# Patient Record
Sex: Male | Born: 1970 | Race: White | Hispanic: No | Marital: Married | Smoking: Former smoker
Health system: Southern US, Community
[De-identification: ages and names within clinical notes are randomized; demographics above are authoritative.]

## PROBLEM LIST (undated history)

## (undated) DIAGNOSIS — F419 Anxiety disorder, unspecified: Secondary | ICD-10-CM

## (undated) DIAGNOSIS — G4730 Sleep apnea, unspecified: Secondary | ICD-10-CM

## (undated) DIAGNOSIS — E039 Hypothyroidism, unspecified: Secondary | ICD-10-CM

## (undated) DIAGNOSIS — S72009A Fracture of unspecified part of neck of unspecified femur, initial encounter for closed fracture: Secondary | ICD-10-CM

## (undated) DIAGNOSIS — K760 Fatty (change of) liver, not elsewhere classified: Secondary | ICD-10-CM

## (undated) DIAGNOSIS — F32A Depression, unspecified: Secondary | ICD-10-CM

## (undated) DIAGNOSIS — I10 Essential (primary) hypertension: Secondary | ICD-10-CM

## (undated) DIAGNOSIS — J45909 Unspecified asthma, uncomplicated: Secondary | ICD-10-CM

## (undated) DIAGNOSIS — E785 Hyperlipidemia, unspecified: Secondary | ICD-10-CM

## (undated) DIAGNOSIS — J189 Pneumonia, unspecified organism: Secondary | ICD-10-CM

## (undated) DIAGNOSIS — K219 Gastro-esophageal reflux disease without esophagitis: Secondary | ICD-10-CM

## (undated) HISTORY — PX: KNEE ARTHROSCOPY: SUR90

## (undated) HISTORY — PX: OTHER SURGICAL HISTORY: SHX169

## (undated) HISTORY — DX: Fatty (change of) liver, not elsewhere classified: K76.0

## (undated) HISTORY — PX: CATARACT EXTRACTION: SUR2

## (undated) HISTORY — DX: Hypothyroidism, unspecified: E03.9

## (undated) HISTORY — PX: WISDOM TOOTH EXTRACTION: SHX21

## (undated) HISTORY — DX: Hyperlipidemia, unspecified: E78.5

## (undated) HISTORY — DX: Unspecified asthma, uncomplicated: J45.909

## (undated) HISTORY — DX: Fracture of unspecified part of neck of unspecified femur, initial encounter for closed fracture: S72.009A

---

## 1998-08-13 DIAGNOSIS — K760 Fatty (change of) liver, not elsewhere classified: Secondary | ICD-10-CM

## 1998-08-13 HISTORY — DX: Fatty (change of) liver, not elsewhere classified: K76.0

## 1999-04-25 ENCOUNTER — Ambulatory Visit (HOSPITAL_BASED_OUTPATIENT_CLINIC_OR_DEPARTMENT_OTHER): Admission: RE | Admit: 1999-04-25 | Discharge: 1999-04-25 | Payer: Self-pay | Admitting: General Surgery

## 1999-05-31 ENCOUNTER — Encounter: Payer: Self-pay | Admitting: Family Medicine

## 1999-05-31 ENCOUNTER — Encounter: Admission: RE | Admit: 1999-05-31 | Discharge: 1999-05-31 | Payer: Self-pay | Admitting: Family Medicine

## 1999-08-14 HISTORY — PX: HERNIA REPAIR: SHX51

## 2004-07-11 ENCOUNTER — Ambulatory Visit: Payer: Self-pay | Admitting: Family Medicine

## 2004-07-17 ENCOUNTER — Ambulatory Visit: Payer: Self-pay | Admitting: Family Medicine

## 2004-08-11 ENCOUNTER — Ambulatory Visit: Payer: Self-pay | Admitting: Family Medicine

## 2004-09-27 ENCOUNTER — Ambulatory Visit: Payer: Self-pay | Admitting: Family Medicine

## 2005-04-02 ENCOUNTER — Ambulatory Visit: Payer: Self-pay | Admitting: Family Medicine

## 2005-04-17 ENCOUNTER — Ambulatory Visit: Payer: Self-pay | Admitting: Family Medicine

## 2006-01-28 ENCOUNTER — Ambulatory Visit: Payer: Self-pay | Admitting: Family Medicine

## 2006-07-25 ENCOUNTER — Encounter: Admission: RE | Admit: 2006-07-25 | Discharge: 2006-07-25 | Payer: Self-pay | Admitting: Family Medicine

## 2006-07-25 ENCOUNTER — Ambulatory Visit: Payer: Self-pay | Admitting: Family Medicine

## 2006-11-07 ENCOUNTER — Ambulatory Visit: Payer: Self-pay | Admitting: Family Medicine

## 2006-11-13 ENCOUNTER — Ambulatory Visit: Payer: Self-pay | Admitting: Family Medicine

## 2007-08-20 ENCOUNTER — Ambulatory Visit: Payer: Self-pay | Admitting: Family Medicine

## 2007-08-20 DIAGNOSIS — K649 Unspecified hemorrhoids: Secondary | ICD-10-CM | POA: Insufficient documentation

## 2007-08-20 DIAGNOSIS — E039 Hypothyroidism, unspecified: Secondary | ICD-10-CM | POA: Insufficient documentation

## 2008-02-03 ENCOUNTER — Emergency Department (HOSPITAL_COMMUNITY): Admission: EM | Admit: 2008-02-03 | Discharge: 2008-02-04 | Payer: Self-pay | Admitting: Emergency Medicine

## 2008-02-09 ENCOUNTER — Ambulatory Visit: Payer: Self-pay | Admitting: Family Medicine

## 2008-02-09 DIAGNOSIS — R51 Headache: Secondary | ICD-10-CM

## 2008-02-09 DIAGNOSIS — R519 Headache, unspecified: Secondary | ICD-10-CM | POA: Insufficient documentation

## 2008-08-24 ENCOUNTER — Ambulatory Visit: Payer: Self-pay | Admitting: Family Medicine

## 2008-10-07 ENCOUNTER — Ambulatory Visit: Payer: Self-pay | Admitting: Family Medicine

## 2008-10-07 DIAGNOSIS — E785 Hyperlipidemia, unspecified: Secondary | ICD-10-CM

## 2008-10-07 DIAGNOSIS — M109 Gout, unspecified: Secondary | ICD-10-CM | POA: Insufficient documentation

## 2010-03-13 ENCOUNTER — Telehealth: Payer: Self-pay | Admitting: Family Medicine

## 2010-03-17 ENCOUNTER — Ambulatory Visit: Payer: Self-pay | Admitting: Family Medicine

## 2010-03-17 LAB — CONVERTED CEMR LAB
Bilirubin Urine: NEGATIVE
Glucose, Urine, Semiquant: NEGATIVE
Ketones, urine, test strip: NEGATIVE
Nitrite: NEGATIVE
Protein, U semiquant: NEGATIVE
Specific Gravity, Urine: 1.02
Urobilinogen, UA: 0.2
WBC Urine, dipstick: NEGATIVE
pH: 5.5

## 2010-03-20 LAB — CONVERTED CEMR LAB
ALT: 65 units/L — ABNORMAL HIGH (ref 0–53)
AST: 39 units/L — ABNORMAL HIGH (ref 0–37)
Albumin: 4.2 g/dL (ref 3.5–5.2)
Alkaline Phosphatase: 59 units/L (ref 39–117)
BUN: 15 mg/dL (ref 6–23)
Basophils Absolute: 0.1 10*3/uL (ref 0.0–0.1)
Basophils Relative: 1 % (ref 0–1)
Bilirubin, Direct: 0.2 mg/dL (ref 0.0–0.3)
CO2: 29 meq/L (ref 19–32)
Calcium: 9.6 mg/dL (ref 8.4–10.5)
Chloride: 102 meq/L (ref 96–112)
Cholesterol: 219 mg/dL — ABNORMAL HIGH (ref 0–200)
Creatinine, Ser: 1.2 mg/dL (ref 0.4–1.5)
Direct LDL: 149.5 mg/dL
Eosinophils Absolute: 0.6 10*3/uL (ref 0.0–0.7)
Eosinophils Relative: 5 % (ref 0–5)
GFR calc non Af Amer: 68.8 mL/min (ref >60)
Glucose, Bld: 88 mg/dL (ref 70–99)
HCT: 46.9 % (ref 39.0–52.0)
HDL: 33.8 mg/dL — ABNORMAL LOW (ref >39.00)
Hemoglobin: 16.2 g/dL (ref 13.0–17.0)
Lymphocytes Relative: 39 % (ref 12–46)
Lymphs Abs: 4.5 10*3/uL — ABNORMAL HIGH (ref 0.7–4.0)
MCHC: 34.5 g/dL (ref 30.0–36.0)
MCV: 91.1 fL (ref 78.0–100.0)
Monocytes Absolute: 1.1 10*3/uL — ABNORMAL HIGH (ref 0.1–1.0)
Monocytes Relative: 9 % (ref 3–12)
Neutro Abs: 5.4 10*3/uL (ref 1.7–7.7)
Neutrophils Relative %: 46 % (ref 43–77)
Platelets: 109 10*3/uL — ABNORMAL LOW (ref 150–400)
Potassium: 4.6 meq/L (ref 3.5–5.1)
RBC: 5.15 M/uL (ref 4.22–5.81)
RDW: 12.8 % (ref 11.5–15.5)
Sodium: 141 meq/L (ref 135–145)
TSH: 0.08 microintl units/mL — ABNORMAL LOW (ref 0.35–5.50)
Total Bilirubin: 0.7 mg/dL (ref 0.3–1.2)
Total CHOL/HDL Ratio: 6
Total Protein: 7.6 g/dL (ref 6.0–8.3)
Triglycerides: 203 mg/dL — ABNORMAL HIGH (ref 0.0–149.0)
VLDL: 40.6 mg/dL — ABNORMAL HIGH (ref 0.0–40.0)
WBC: 11.5 10*3/uL — ABNORMAL HIGH (ref 4.0–10.5)
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
## 2010-09-12 NOTE — Progress Notes (Signed)
Summary: med refill  Phone Note Refill Request Call back at Home Phone [phone]   Called pt sais Rx was sent in but was denied Pt said he needs meds before office visit on 03/17/10. Ok to fill a few pills?  Initial call taken by: Kathrynn Speed CMA,  March 13, 2010 11:03 AM Caller: Patient Call For: Nelwyn Salisbury MD Summary of Call: pt has ov sch for 03-17-2010 he needs refill [facility]  Initial call taken by: Heron Sabins,  March 13, 2010 9:19 AM  Follow-up for Phone Call        call in #30 with no rf Follow-up by: Nelwyn Salisbury MD,  March 13, 2010 5:55 PM    Prescriptions: SYNTHROID 50 MCG  TABS (LEVOTHYROXINE SODIUM) 1 by mouth once daily  #30 Tablet x 0   Entered by:   Josph Macho RMA   Authorized by:   Nelwyn Salisbury MD   Signed by:   Josph Macho RMA on 03/14/2010   Method used:   Electronically to        [facility]  [phone]* (retail)       [address] [place], [place]  [address]       Ph: [phone] or [phone]       Fax: [phone]   RxID:   [id number] SYNTHROID 200 MCG TABS (LEVOTHYROXINE SODIUM) 1 by mouth once daily  #30 Tablet x 0   Entered by:   Josph Macho RMA   Authorized by:   Nelwyn Salisbury MD   Signed by:   Josph Macho RMA on 03/14/2010   Method used:   Electronically to        [facility]  [phone]* (retail)       [address] [place], [place]  [address]       Ph: [phone] or [phone]       Fax: [phone]   RxID:   [id number]   Appended Document: med refill Left message on VM stating Synthroid was called in

## 2010-12-26 NOTE — Op Note (Signed)
NAME:  Matthew Reese, Matthew Reese               ACCOUNT NO.:  [id number]   MEDICAL RECORD NO.:  [id number]          PATIENT TYPE:  EMS   LOCATION:  [facility]                         FACILITY:  [facility]   PHYSICIAN:  Dionne Ano. Gramig III, M.D.DATE OF BIRTH:  1970/12/11   DATE OF PROCEDURE:  DATE OF DISCHARGE:                               OPERATIVE REPORT   I had the pleasure to see Mack Guise in [facility] at  10:00 p.m. in regards to his left upper extremity predicament.  The  patient had a drill bit injury to his first web space with resultant  pain today.  The patient has had his tetanus status addressed.  He  complains of significant and severe pain.   I should note, he notes no other injury.  He denies locking, popping,  catching, or mechanical symptoms in the joint.   ALLERGIES:  None.   MEDICINE:  Synthroid   PAST MEDICAL HISTORY:  Hypothyroid   PAST SURGICAL HISTORY:  Hip surgery and hernia surgery.   SOCIAL HISTORY:  He does not smoke or drink.  He works as an  Personnel officer.  He is here with his wife many years, whose is pregnant  with their first child.   PHYSICAL EXAMINATION:  GENERAL:  The patient is alert and oriented in no  acute distress.  VITAL SIGNS:  Stable.   The patient has full sensation to the fingertips.  He did not have any  Kanavel signs.  He did not have any signs of compartment syndrome.  He  is tender over the first web with opening with the drill bit entered.  I  specifically note that his flexor and extensor tendons are intact.   His web space is the most significant area of tenderness, where the  entrance site is.   I have reviewed these findings with him at length.  X-rays are negative  for fracture dislocation or specifying lesion.   IMPRESSION:  Drill bit injury to the first web space with resultant pain  and open wound.   PLAN:  I have discussed with the patient his findings.  I have consented  him for I&D.   PROCEDURE NOTE:  The  patient was taken to the procedural area.  He  underwent a field block with lidocaine.  Following this, we performed  I&D after sterile prep and drape with Betadine scrub and paint.  I&D of  skin, subcutaneous tissue, and muscle was accomplished.  He was  irrigated copiously.  The wound was packed open.   Once this was complete, I discussed with him elevation, precautions, and  asked him to take Augmentin 875 mg 1 p.o. b.i.d. x10 days.  We have  given him Percocet for pain, asked him not to work, drink alcohol, or  drive a car with  pain medicine, and asked him to return to the office tomorrow at 4:45  for a repeat evaluation and appointment, so that we can make sure that  he is improving.  I have gone over these issues with him at length, the  do's and dont's etc. and all  questions have been encouraged and  answered.      Dionne Ano. Everlene Other, M.D.     Nash Mantis  D:  02/04/2008  T:  02/05/2008  Job:  [id number]

## 2010-12-29 NOTE — Assessment & Plan Note (Signed)
[facility]                            [facility] OFFICE NOTE   NAME:Lattanzio, Matthew Reese                      MRN:          [id number]  DATE:11/13/2006                            DOB:          Dec 13, 1970    This is a 40 year old gentleman here for complete physical examination.  Generally,  he feels good and has no complaints. We have been following  him for hypothyroidism and also for high cholesterol. He was treated for  a time with medications for his cholesterol, but he has been very  resistant to taking these and has been off of them for about the past  year. Unfortunately, he admits to eating a very poor diet with lots of  fatty foods. Further details of his past medical history, family  history, social history, habits, etc..refer to our last physical note  dated September 29, 2003.   ALLERGIES:  None.   MEDICATIONS:  Synthroid a total of 250 mcg per day.   OBJECTIVE:  Height is 6 feet, 9 inches. Weight is 316, blood pressure  110/78, pulse 76 and regular. In general, he remains overweight.  SKIN: Is clear.  EYES: Are clear.  EARS: Are clear.  PHARYNX: Is clear.  NECK: Supple, without lymphadenopathy or thyromegaly.  LUNGS:  Clear.  CARDIAC: Rate and rhythm are regular without gallops, murmur or rubs.  Distal pulses are full.  ABDOMEN: Soft. Normal bowel sounds, nontender and no masses.  GENITALIA: Normal male.  EXTREMITIES: No clubbing, cyanosis or edema.  NEUROLOGIC: Grossly intact.   The patient was here on March 27th to have fasting blood work drawn. His  urinalysis that day was normal. Unfortunately, we cannot retrieve the  results of any of his other tests.   ASSESSMENT/PLAN:  1. Complete physical. We talked about dropping weight.  2. Hyperlipidemia. He will reschedule to come back one morning in a      fasting state to check a lipid panel with our other laboratories.  3. Hypothyroidism. Will check a TSH when he comes back for lab  work as      well.     Tera Mater. Clent Ridges, MD  Electronically Signed    SAF/MedQ  DD: 11/13/2006  DT: 11/13/2006  Job #: [id number]

## 2011-04-03 ENCOUNTER — Other Ambulatory Visit: Payer: Self-pay | Admitting: Family Medicine

## 2011-05-07 ENCOUNTER — Other Ambulatory Visit: Payer: Self-pay | Admitting: Family Medicine

## 2011-05-10 ENCOUNTER — Other Ambulatory Visit: Payer: Self-pay | Admitting: Family Medicine

## 2011-05-10 LAB — DIFFERENTIAL
Basophils Absolute: 0.1
Basophils Relative: 1
Eosinophils Relative: 3
Monocytes Absolute: 1
Monocytes Relative: 8
Neutro Abs: 7.1

## 2011-05-10 LAB — CBC
HCT: 48.3
Hemoglobin: 17.1 — ABNORMAL HIGH
MCHC: 35.4
RBC: 5.23
RDW: 12.9

## 2011-05-10 LAB — BASIC METABOLIC PANEL
CO2: 24
Calcium: 9.7
Chloride: 106
GFR calc Af Amer: >60
Glucose, Bld: 93
Potassium: 4
Sodium: 140

## 2011-05-10 MED ORDER — LEVOTHYROXINE SODIUM 50 MCG PO TABS: 50.0000 ug | ORAL_TABLET | Freq: Every day | ORAL | Status: DC

## 2011-05-10 MED ORDER — LEVOTHYROXINE SODIUM 200 MCG PO TABS: 200.0000 ug | ORAL_TABLET | Freq: Every day | ORAL | Status: DC

## 2011-05-10 NOTE — Telephone Encounter (Signed)
Pt called and has sch a fasting cpx for 06/07/11 at 10am. Pt req refill of SYNTHROID 200 MCG tablet and [facility] [address]. Pt is completely out of med. Pls call in today.

## 2011-05-10 NOTE — Telephone Encounter (Signed)
rx sent in to pharmacy.  Pt has an appt on 06/07/11.

## 2011-06-07 ENCOUNTER — Encounter: Payer: Self-pay | Admitting: Family Medicine

## 2011-06-07 ENCOUNTER — Telehealth: Payer: Self-pay | Admitting: Internal Medicine

## 2011-06-07 ENCOUNTER — Ambulatory Visit (INDEPENDENT_AMBULATORY_CARE_PROVIDER_SITE_OTHER): Payer: BC Managed Care – PPO | Admitting: Family Medicine

## 2011-06-07 VITALS — BP 132/82 | HR 61 | Temp 98.5°F | Ht >= 80 in | Wt 306.0 lb

## 2011-06-07 DIAGNOSIS — Z0000 Encounter for general adult medical examination without abnormal findings: Secondary | ICD-10-CM

## 2011-06-07 DIAGNOSIS — R109 Unspecified abdominal pain: Secondary | ICD-10-CM

## 2011-06-07 DIAGNOSIS — E039 Hypothyroidism, unspecified: Secondary | ICD-10-CM

## 2011-06-07 LAB — BASIC METABOLIC PANEL
BUN: 14 mg/dL (ref 6–23)
CO2: 27 mEq/L (ref 19–32)
Chloride: 105 mEq/L (ref 96–112)
Potassium: 4.1 mEq/L (ref 3.5–5.1)

## 2011-06-07 LAB — POCT URINALYSIS DIPSTICK
Ketones, UA: NEGATIVE
Protein, UA: NEGATIVE
Spec Grav, UA: 1.03
Urobilinogen, UA: 0.2
pH, UA: 5.5

## 2011-06-07 LAB — HEPATIC FUNCTION PANEL
ALT: 92 U/L — ABNORMAL HIGH (ref 0–53)
AST: 57 U/L — ABNORMAL HIGH (ref 0–37)
Total Bilirubin: 1 mg/dL (ref 0.3–1.2)
Total Protein: 7.6 g/dL (ref 6.0–8.3)

## 2011-06-07 LAB — LIPID PANEL
Cholesterol: 191 mg/dL (ref 0–200)
LDL Cholesterol: 120 mg/dL — ABNORMAL HIGH (ref 0–99)
Triglycerides: 171 mg/dL — ABNORMAL HIGH (ref 0.0–149.0)

## 2011-06-07 MED ORDER — LEVOTHYROXINE SODIUM 50 MCG PO TABS: 50.0000 ug | ORAL_TABLET | Freq: Every day | ORAL | Status: DC

## 2011-06-07 MED ORDER — LEVOTHYROXINE SODIUM 200 MCG PO TABS: 200.0000 ug | ORAL_TABLET | Freq: Every day | ORAL | Status: DC

## 2011-06-07 NOTE — Progress Notes (Signed)
  Subjective:    Patient ID: Matthew Reese, male    DOB: June 28, 1971, 40 y.o.   MRN: [id number]  HPI 40 yr old male for a cpx. He has been doing well until about 2 weeks ago when he started to have some intermittent sharp mid abdominal pains, and he has had some bright red blood mixed with his stools several times. His stools and soft not hard, and they are not difficult to pass. No fevers or urinary symptoms. He thinks he had a colonoscopy around 10 years ago, but we cannot find a report of this in our records. He quit smoking a few months ago and has done well with this.    Review of Systems  Constitutional: Negative.   HENT: Negative.   Eyes: Negative.   Respiratory: Negative.   Cardiovascular: Negative.   Gastrointestinal: Positive for abdominal pain and blood in stool. Negative for nausea, vomiting, diarrhea, constipation, abdominal distention and rectal pain.  Genitourinary: Negative.   Musculoskeletal: Negative.   Skin: Negative.   Neurological: Negative.   Hematological: Negative.   Psychiatric/Behavioral: Negative.        Objective:   Physical Exam  Constitutional: He is oriented to person, place, and time. He appears well-developed and well-nourished. No distress.  HENT:  Head: Normocephalic and atraumatic.  Right Ear: External ear normal.  Left Ear: External ear normal.  Nose: Nose normal.  Mouth/Throat: Oropharynx is clear and moist. No oropharyngeal exudate.  Eyes: Conjunctivae and EOM are normal. Pupils are equal, round, and reactive to light. Right eye exhibits no discharge. Left eye exhibits no discharge. No scleral icterus.  Neck: Neck supple. No JVD present. No tracheal deviation present. No thyromegaly present.  Cardiovascular: Normal rate, regular rhythm, normal heart sounds and intact distal pulses.  Exam reveals no gallop and no friction rub.   No murmur heard. Pulmonary/Chest: Effort normal and breath sounds normal. No respiratory distress. He has no  wheezes. He has no rales. He exhibits no tenderness.  Abdominal: Soft. Bowel sounds are normal. He exhibits no distension and no mass. There is no tenderness. There is no rebound and no guarding.  Genitourinary: Penis normal. No penile tenderness.  Musculoskeletal: Normal range of motion. He exhibits no edema and no tenderness.  Lymphadenopathy:    He has no cervical adenopathy.  Neurological: He is alert and oriented to person, place, and time. He has normal reflexes. No cranial nerve deficit. He exhibits normal muscle tone. Coordination normal.  Skin: Skin is warm and dry. No rash noted. He is not diaphoretic. No erythema. No pallor.  Psychiatric: He has a normal mood and affect. His behavior is normal. Judgment and thought content normal.          Assessment & Plan:  Well exam. Get fasting labs today. Set up another colonoscopy

## 2011-06-08 ENCOUNTER — Other Ambulatory Visit: Payer: Self-pay | Admitting: Family Medicine

## 2011-06-08 NOTE — Telephone Encounter (Signed)
Scheduled pt to see Dr Rhea Belton on 06/12/11 at 10:30am for bloody stools and abdominal pain. Notified [facility].

## 2011-06-11 ENCOUNTER — Encounter: Payer: Self-pay | Admitting: *Deleted

## 2011-06-12 ENCOUNTER — Encounter: Payer: Self-pay | Admitting: Internal Medicine

## 2011-06-12 ENCOUNTER — Other Ambulatory Visit (INDEPENDENT_AMBULATORY_CARE_PROVIDER_SITE_OTHER): Payer: BC Managed Care – PPO

## 2011-06-12 ENCOUNTER — Ambulatory Visit (INDEPENDENT_AMBULATORY_CARE_PROVIDER_SITE_OTHER): Payer: BC Managed Care – PPO | Admitting: Internal Medicine

## 2011-06-12 VITALS — BP 138/82 | HR 68 | Ht >= 80 in | Wt 321.2 lb

## 2011-06-12 DIAGNOSIS — R7989 Other specified abnormal findings of blood chemistry: Secondary | ICD-10-CM

## 2011-06-12 DIAGNOSIS — K625 Hemorrhage of anus and rectum: Secondary | ICD-10-CM

## 2011-06-12 DIAGNOSIS — K921 Melena: Secondary | ICD-10-CM

## 2011-06-12 LAB — IBC PANEL: Saturation Ratios: 38.3 % (ref 20.0–50.0)

## 2011-06-12 LAB — FERRITIN: Ferritin: 415 ng/mL — ABNORMAL HIGH (ref 22.0–322.0)

## 2011-06-12 LAB — HEPATITIS C ANTIBODY: HCV Ab: NEGATIVE

## 2011-06-12 MED ORDER — PEG-KCL-NACL-NASULF-NA ASC-C 100 G PO SOLR: 1.0000 | Freq: Once | ORAL | Status: DC

## 2011-06-12 NOTE — Progress Notes (Signed)
Subjective:    Patient ID: Matthew Reese, male    DOB: 1970/09/06, 40 y.o.   MRN: [id number]  HPI Matthew Reese is a 40 yo male with PMH of HL, gout, hypothyroidism, headaches, question of fatty liver disease who is seen in consultation at the request of Dr. Clent Ridges for evaluation of abdominal pain and rectal bleeding.  The patient states in late September and early October of this year he developed lower abdominal pain, which he describes as a cramping pain associated with red and dark red rectal bleeding. He states that the lower abdominal cramping tended to get better after bowel movement, was not necessarily associated with eating, and was otherwise fairly unpredictable. It lasted for 7-10 days and has abated.  He reports no abdominal pain now. He reports his appetite is "very good" and this seems to have improved after he recently stopped smoking. He denies nausea or vomiting. He reports his bowel movements now are "normal" occurring once daily. He reports his stools are formed without blood or melena. He states the rectal bleeding that he was seeing lasted on and off for 2 weeks, initially was red and then turned dark red with clotting. He denies pain with passing his stools, but notes a history of hemorrhoids. He reports a greater than 10 years ago he underwent evaluation for rectal bleeding with flexible sigmoidoscopy. This was an unsedated office procedure. He reports at this time polyps were removed and hemorrhoids were seen. Of note the records including pathology is not available at this time. He also is reporting some mild tenesmus, but no rectal pain.  Regarding his elevated liver enzymes, he states this is been a problem for him on and off for some time. He is not sure what this is related to. He denies any medicines over-the-counter or herbal supplements. He does drink alcohol, but notes significantly less than years past. He reports now history June is more sporadic and occurs on the weekends, but  not necessarily Saturday and Sunday. When he drinks usually drinks beers, and he reports he can drink up to 18 in one sitting.  He rarely drinks liquor.   Review of Systems Constitutional: Negative for fever, chills, night sweats, activity change, appetite change and unexpected weight change HEENT: Negative for sore throat, mouth sores and trouble swallowing. Eyes: Negative for visual disturbance Respiratory: Negative for cough, chest tightness and shortness of breath Cardiovascular: Negative for chest pain, palpitations and lower extremity swelling Gastrointestinal: See history of present illness Genitourinary: Negative for dysuria and hematuria. Musculoskeletal: Negative for back pain, arthralgias and myalgias Skin: Negative for rash or color change Neurological: Negative for headaches, weakness, numbness Hematological: Negative for adenopathy, negative for easy bruising/bleeding Psychiatric/behavioral: Negative for depressed mood, negative for anxiety   Patient Active Problem List  Diagnoses  . HYPOTHYROIDISM  . HYPERLIPIDEMIA  . GOUT  . HEMORRHOIDS  . ACUTE SINUSITIS, UNSPECIFIED  . HEADACHE  . LACERATION, HAND  . LACERATION, FINGER   Current Outpatient Prescriptions  Medication Sig Dispense Refill  . levothyroxine (SYNTHROID) 200 MCG tablet Take 1 tablet (200 mcg total) by mouth daily.  30 tablet  11  . levothyroxine (SYNTHROID) 50 MCG tablet Take 1 tablet (50 mcg total) by mouth daily.  30 tablet  11  . peg 3350 powder (MOVIPREP) 100 G SOLR Take 1 kit (100 g total) by mouth once.  1 kit  0   No Known Allergies  Family History  Problem Relation Age of Onset  . Breast cancer  Mother   . Diabetes Mother   . Lung cancer Mother   --negative for known history of colorectal cancer, polyps, IBD   Social History  . Marital Status: Single    Number of Children: 1, 3 yo daughter   Occupational History  . Electrician [employer]   Social History Main Topics  . Smoking status:  Former Smoker x 20 yrs    Types: Cigarettes    Quit date: 05/13/2011  . Smokeless tobacco: Never Used  . Alcohol Use: See HPI    2 drink(s) per week  . Drug Use: Yes    Special: Marijuana     once a month       Objective:   Physical Exam BP 138/82  Pulse 68  Ht 6\' 9"  (2.057 m)  Wt 321 lb 3.2 oz (145.695 kg)  BMI 34.42 kg/m2 Constitutional: Well-developed and well-nourished. No distress. HEENT: Normocephalic and atraumatic. Oropharynx is clear and moist. No oropharyngeal exudate. Conjunctivae are normal. Pupils are equal round and reactive to light. No scleral icterus. Neck: Neck supple. Trachea midline. Cardiovascular: Normal rate, regular rhythm and intact distal pulses. No M/R/G Pulmonary/chest: Effort normal and breath sounds normal. No wheezing, rales or rhonchi. Abdominal: Soft, nontender, nondistended. Bowel sounds active throughout. There are no masses palpable. No hepatosplenomegaly. Extremities: no clubbing, cyanosis, or edema Lymphadenopathy: No cervical adenopathy noted. Neurological: Alert and oriented to person place and time. Skin: Skin is warm and dry. No rashes noted.  No palmar erythema or spider angiomata seen Psychiatric: Normal mood and affect. Behavior is normal.  CMP     Component Value Date/Time   NA 141 06/07/2011 1106   K 4.1 06/07/2011 1106   CL 105 06/07/2011 1106   CO2 27 06/07/2011 1106   GLUCOSE 86 06/07/2011 1106   BUN 14 06/07/2011 1106   CREATININE 1.3 06/07/2011 1106   CALCIUM 9.1 06/07/2011 1106   PROT 7.6 06/07/2011 1106   ALBUMIN 4.2 06/07/2011 1106   AST 57* 06/07/2011 1106   ALT 92* 06/07/2011 1106   ALKPHOS 56 06/07/2011 1106   BILITOT 1.0 06/07/2011 1106      Assessment & Plan:  40 yo male with PMH of HL, gout, hypothyroidism, headaches, question of fatty liver disease who is seen in consultation at the request of Dr. Clent Ridges for evaluation of abdominal pain and rectal bleeding.  Also with a history of mild transaminitis.  1.  Rectal bleeding/abd pain -- the patient's abdominal pain for the most part has resolved, and at present he is not having rectal bleeding. He did have multiple episodes over several days of hematochezia in this warrants further evaluation. He believes that he has a history of colon polyps and hemorrhoids but I cannot verify this with a procedure note or pathology report at this time. Either way colonoscopy is warranted. We have discussed this test today and he is willing to proceed. This will be done with propofol sedation. The differential includes diverticular hemorrhage, polyps, angioectasias, and inflammation such as proctitis.    2. Transaminitis -- the patient does have a history of mild elevation in AST and ALT. We discussed this today and the concern of chronic low level liver inflammation and the long-term risk of scarring. At present the patient has no evidence of decompensated liver disease or of advanced liver disease/cirrhosis.  I think further workup is warranted at this time given the persistence in his transaminitis. Of note, recently his TSH was noted to be low indicating too much exogenous thyroid  supplementation. Dr. Clent Ridges has decreased his levothyroxine dose and plans to recheck a TSH in 3 months. It is possible that additional thyroid hormone can cause elevations in transaminases, and thus if nothing is revealed on further workup today, rechecking once his LFTs, once hisTSH normalizes is reasonable.  Today I will complete the workup for chronic liver disease/elevated LFTs with Hep B, C serologies, iron studies, ANA, IGG, ASMA, alpha one, ceruloplasmin.  I also will order a liver ultrasound to rule out lesions and to evaluate for fatty liver disease.  Finally we discussed alcohol and how this can contribute both to fatty liver disease and elevation in serum transaminases. Of note his AST to ALT ratio is not consistent with alcoholic injury, but it is certainly possible that alcohol consumption can  contribute to his fatty liver disease and thus inflammation.  He will return for colonoscopy and then in 3 months for elevated LFTs.

## 2011-06-12 NOTE — Patient Instructions (Signed)
You have been scheduled for a colonoscopy. With propofol Please follow written instructions given to you at your visit today.  Please pick up your prep kit at the pharmacy within the next 2-3 days.  You have been scheduled for an abdominal ultrasound at [facility] (1st floor of hospital) on 06/19/2011 at 8:00am. Please arrive 15 minutes prior to your appointment for registration. Make certain not to have anything to eat or drink 6 hours prior to your appointment. Should you need to reschedule your appointment, please contact radiology at [phone]  Your physician has requested that you go to the basement for lab work before leaving today:   Dr. Rhea Belton would like to see you back in the office for a follow up in 3 months.

## 2011-06-13 LAB — IGG: IgG (Immunoglobin G), Serum: 1220 mg/dL (ref 650–1600)

## 2011-06-14 ENCOUNTER — Telehealth: Payer: Self-pay | Admitting: Family Medicine

## 2011-06-14 NOTE — Telephone Encounter (Signed)
Message copied by Baldemar Friday on Thu Jun 14, 2011  1:30 PM ------      Message from: Gershon Crane A      Created: Mon Jun 11, 2011  5:55 AM       Normal except his lipids remain high and his liver enzymes are slightly elevated. Watch a strict low fat diet. His Synthroid dose is a bit high so stop taking the 50 mcg tablet and take only the 200 mcg tablet daily. Recheck a TSH in 90 days

## 2011-06-14 NOTE — Telephone Encounter (Signed)
Left voice message for pt to return my call.

## 2011-06-15 LAB — ANTI-SMOOTH MUSCLE ANTIBODY, IGG: Smooth Muscle Ab: 8 U (ref <20)

## 2011-06-18 ENCOUNTER — Encounter: Payer: Self-pay | Admitting: Family Medicine

## 2011-06-19 ENCOUNTER — Telehealth: Payer: Self-pay | Admitting: *Deleted

## 2011-06-19 ENCOUNTER — Ambulatory Visit (HOSPITAL_COMMUNITY)
Admission: RE | Admit: 2011-06-19 | Discharge: 2011-06-19 | Disposition: A | Discharge status: Home / Self Care | Payer: BC Managed Care – PPO | Source: Ambulatory Visit | Attending: Internal Medicine | Admitting: Internal Medicine

## 2011-06-19 DIAGNOSIS — K7689 Other specified diseases of liver: Secondary | ICD-10-CM | POA: Insufficient documentation

## 2011-06-19 DIAGNOSIS — R7989 Other specified abnormal findings of blood chemistry: Secondary | ICD-10-CM | POA: Insufficient documentation

## 2011-06-19 NOTE — Progress Notes (Signed)
Addended by: Aniceto Boss A on: 06/19/2011 09:22 AM   Modules accepted: Orders

## 2011-06-19 NOTE — Telephone Encounter (Signed)
Message copied by Florene Glen on Tue Jun 19, 2011  2:07 PM ------      Message from: Beverley Fiedler      Created: Tue Jun 19, 2011  9:32 AM       [place] reviewed.  Shows persistence of fatty liver.  Proceed as discussed in clinic.  No liver lesions/masses seen.  Cutting back on etoh will be helpful.

## 2011-06-19 NOTE — Telephone Encounter (Signed)
Message copied by Florene Glen on Tue Jun 19, 2011  2:06 PM ------      Message from: Beverley Fiedler      Created: Tue Jun 19, 2011  9:32 AM       [place] reviewed.  Shows persistence of fatty liver.  Proceed as discussed in clinic.  No liver lesions/masses seen.  Cutting back on etoh will be helpful.

## 2011-06-19 NOTE — Telephone Encounter (Signed)
Labs reviewed. Nothing obvious found to explain elevated LFTs. Fatty liver is likely the cause and certainly the intermittent ETOH can add to fatty liver disease. I would like for him to normalize this TSH, hopefully this will happen with the new dose of synthroid recommended by Dr. Clent Ridges (PCP). Once TSH is normal, we can recheck the LFTs to see the trend. US liver is pending, as is colonoscopy. Alpha-1 is low 1 unit into abnl and I do not think this is clinically significant, esp given no family history of liver/lung dx. Proceed with colon as discussed and f/u in clinic in 3 months.   Informed pt of Dr Lauro Franklin findings and recommendations. Also informed him of u/s of liver results showing a fatty liver and that cutting back on alcohol can help. Pt will proceed with his COLON on 07/10/11 at 3pm and I will put in a reminder for f/u in 3 months; pt stated understanding.

## 2011-06-20 NOTE — Telephone Encounter (Signed)
Spoke with pt and gave results, future order for labs are in system.

## 2011-07-10 ENCOUNTER — Encounter: Payer: Self-pay | Admitting: Internal Medicine

## 2011-07-10 ENCOUNTER — Ambulatory Visit (AMBULATORY_SURGERY_CENTER): Payer: BC Managed Care – PPO | Admitting: Internal Medicine

## 2011-07-10 DIAGNOSIS — R7989 Other specified abnormal findings of blood chemistry: Secondary | ICD-10-CM

## 2011-07-10 DIAGNOSIS — K625 Hemorrhage of anus and rectum: Secondary | ICD-10-CM

## 2011-07-10 DIAGNOSIS — D126 Benign neoplasm of colon, unspecified: Secondary | ICD-10-CM

## 2011-07-10 DIAGNOSIS — K921 Melena: Secondary | ICD-10-CM

## 2011-07-10 HISTORY — PX: COLONOSCOPY: SHX174

## 2011-07-10 MED ORDER — SODIUM CHLORIDE 0.9 % IV SOLN: 500.0000 mL | INTRAVENOUS | Status: DC

## 2011-07-10 NOTE — Patient Instructions (Signed)
Handouts given on diverticulosis, high fiber diet, polyps, hemorrhoids  Discharge instructions per blue and green sheets  Repeat colonoscopy pending pathology results  We will mail you a letter in 1-2 weeks with pathology results and dr pyrtles recommendations

## 2011-07-10 NOTE — Progress Notes (Signed)
Patient did not experience any of the following events: a burn prior to discharge; a fall within the facility; wrong site/side/patient/procedure/implant event; or a hospital transfer or hospital admission upon discharge from the facility. (G8907) Patient did not have preoperative order for IV antibiotic SSI prophylaxis. (G8918)  

## 2011-07-11 ENCOUNTER — Telehealth: Payer: Self-pay | Admitting: *Deleted

## 2011-07-11 NOTE — Telephone Encounter (Signed)

## 2011-07-12 ENCOUNTER — Telehealth: Payer: Self-pay | Admitting: Family Medicine

## 2011-07-12 MED ORDER — AZITHROMYCIN 250 MG PO TABS: ORAL_TABLET | ORAL | Status: AC

## 2011-07-12 NOTE — Telephone Encounter (Signed)
Wants a zpak. Has chest and head congestion x 3 weeks. [facility].

## 2011-07-12 NOTE — Telephone Encounter (Signed)
Script sent e-scribe 

## 2011-07-12 NOTE — Telephone Encounter (Signed)
Call in a Zpack  ?

## 2011-07-17 ENCOUNTER — Encounter: Payer: Self-pay | Admitting: Internal Medicine

## 2011-09-19 ENCOUNTER — Telehealth: Payer: Self-pay | Admitting: *Deleted

## 2011-09-19 NOTE — Telephone Encounter (Signed)
Informed pt Dr Rhea Belton recommended he f/u around February is path was ok. Pt reports he doesn't have the money to f/u with [place] and Dr Clent Ridges. He reports he's almost stopped his ETOH use and he is to f/u with Dr Clent Ridges for Synthroid dose change. Explained that one of the reason's Dr Rhea Belton wanted to f/u was to recheck his LFT's; pt stated he will get Dr Clent Ridges to do that and f/u with [place] when needed.

## 2011-09-19 NOTE — Telephone Encounter (Signed)
Message copied by Florene Glen on Wed Sep 19, 2011 10:01 AM ------      Message from: Florene Glen      Created: Tue Jun 19, 2011  2:14 PM       F/u appt in 3 months unless COLON on 07/10/11 shows something differently. Around february

## 2011-09-19 NOTE — Telephone Encounter (Signed)
lmom for pt to call back. Per Dr Lauro Franklin note on 06/19/11, if path is negative, pt needs to f/u in 3 months.

## 2011-09-26 NOTE — Telephone Encounter (Signed)
This is okay with me as long as is okay with Dr. Clent Ridges

## 2011-09-26 NOTE — Telephone Encounter (Signed)
Dr Clent Ridges, is it ok if pt follows up with you? Thanks.

## 2011-09-27 NOTE — Telephone Encounter (Signed)
Yes I would be happy to follow his LFT' s etc.

## 2012-01-22 ENCOUNTER — Telehealth: Payer: Self-pay | Admitting: Family Medicine

## 2012-01-22 DIAGNOSIS — E039 Hypothyroidism, unspecified: Secondary | ICD-10-CM

## 2012-01-22 NOTE — Telephone Encounter (Signed)
He already has an order for this. Just make a lab appt

## 2012-01-22 NOTE — Telephone Encounter (Signed)
Pt called and said that he was suppose to get tsh lvls rechecked 4 month from last ov in November. Pls re-order the necessary labs and advise.

## 2012-01-22 NOTE — Telephone Encounter (Signed)
Called pt and schd for ordered labs as noted.

## 2012-01-23 ENCOUNTER — Other Ambulatory Visit (INDEPENDENT_AMBULATORY_CARE_PROVIDER_SITE_OTHER): Payer: BC Managed Care – PPO

## 2012-01-23 DIAGNOSIS — E039 Hypothyroidism, unspecified: Secondary | ICD-10-CM

## 2012-01-23 LAB — TSH: TSH: 0.27 u[IU]/mL — ABNORMAL LOW (ref 0.35–5.50)

## 2012-01-28 ENCOUNTER — Encounter: Payer: Self-pay | Admitting: Family Medicine

## 2012-01-28 MED ORDER — LEVOTHYROXINE SODIUM 150 MCG PO TABS: 150.0000 ug | ORAL_TABLET | Freq: Every day | ORAL | Status: DC

## 2012-01-28 NOTE — Addendum Note (Signed)
Addended by: Aniceto Boss A on: 01/28/2012 02:03 PM   Modules accepted: Orders

## 2012-01-28 NOTE — Progress Notes (Signed)
Quick Note:  I spoke with pt, put a copy of results in mail and sent script e-scribe. ______ 

## 2012-09-15 ENCOUNTER — Ambulatory Visit (INDEPENDENT_AMBULATORY_CARE_PROVIDER_SITE_OTHER): Payer: BC Managed Care – PPO | Admitting: Family Medicine

## 2012-09-15 ENCOUNTER — Encounter: Payer: Self-pay | Admitting: Family Medicine

## 2012-09-15 VITALS — BP 138/80 | HR 83 | Temp 98.0°F | Wt 322.0 lb

## 2012-09-15 DIAGNOSIS — L909 Atrophic disorder of skin, unspecified: Secondary | ICD-10-CM

## 2012-09-15 DIAGNOSIS — L918 Other hypertrophic disorders of the skin: Secondary | ICD-10-CM

## 2012-09-15 NOTE — Progress Notes (Signed)
  Subjective:    Patient ID: Matthew Reese, male    DOB: [birth year], 42 y.o.   MRN: [id number]  HPI Here to remove a skin tag in the right groin area that is getting larger and it gets painful from rubbing on his underwear.    Review of Systems  Constitutional: Negative.        Objective:   Physical Exam  Constitutional: He appears well-developed and well-nourished.  Skin:       Pedunculated fleshy papule on the right proximal thigh           Assessment & Plan:  The area was cleansed with an alcohol swab. The lesion was excised at the base using scissors. Wound was dressed with Neosporin and gauze.

## 2012-09-23 ENCOUNTER — Ambulatory Visit: Payer: BC Managed Care – PPO

## 2012-09-23 ENCOUNTER — Encounter: Payer: Self-pay | Admitting: Family Medicine

## 2012-09-23 ENCOUNTER — Telehealth: Payer: Self-pay

## 2012-09-23 ENCOUNTER — Ambulatory Visit (INDEPENDENT_AMBULATORY_CARE_PROVIDER_SITE_OTHER): Payer: BC Managed Care – PPO | Admitting: Family Medicine

## 2012-09-23 VITALS — BP 124/86 | HR 97 | Temp 98.9°F | Ht >= 80 in | Wt 333.0 lb

## 2012-09-23 DIAGNOSIS — Z0000 Encounter for general adult medical examination without abnormal findings: Secondary | ICD-10-CM

## 2012-09-23 DIAGNOSIS — M109 Gout, unspecified: Secondary | ICD-10-CM

## 2012-09-23 DIAGNOSIS — J019 Acute sinusitis, unspecified: Secondary | ICD-10-CM

## 2012-09-23 LAB — HEPATIC FUNCTION PANEL
ALT: 66 U/L — ABNORMAL HIGH (ref 0–53)
AST: 35 U/L (ref 0–37)
Alkaline Phosphatase: 52 U/L (ref 39–117)
Bilirubin, Direct: 0 mg/dL (ref 0.0–0.3)
Total Bilirubin: 0.9 mg/dL (ref 0.3–1.2)
Total Protein: 8 g/dL (ref 6.0–8.3)

## 2012-09-23 LAB — POCT URINALYSIS DIPSTICK
Ketones, UA: NEGATIVE
Protein, UA: NEGATIVE
Spec Grav, UA: 1.02
Urobilinogen, UA: 0.2

## 2012-09-23 LAB — URIC ACID: Uric Acid, Serum: 6.3 mg/dL (ref 4.0–7.8)

## 2012-09-23 LAB — BASIC METABOLIC PANEL
BUN: 13 mg/dL (ref 6–23)
GFR: 63.21 mL/min (ref >60.00)
Glucose, Bld: 79 mg/dL (ref 70–99)
Potassium: 4.2 mEq/L (ref 3.5–5.1)

## 2012-09-23 LAB — LIPID PANEL
Cholesterol: 185 mg/dL (ref 0–200)
HDL: 27.6 mg/dL — ABNORMAL LOW (ref >39.00)
VLDL: 30.2 mg/dL (ref 0.0–40.0)

## 2012-09-23 MED ORDER — METHYLPREDNISOLONE ACETATE 80 MG/ML IJ SUSP
120.0000 mg | Freq: Once | INTRAMUSCULAR | Status: AC
  Administered 2012-09-23: 120 mg via INTRAMUSCULAR

## 2012-09-23 MED ORDER — AMOXICILLIN-POT CLAVULANATE 875-125 MG PO TABS: 1.0000 | ORAL_TABLET | Freq: Two times a day (BID) | ORAL | Status: DC

## 2012-09-23 NOTE — Progress Notes (Signed)
  Subjective:    Patient ID: Matthew Reese, male    DOB: 1971/04/21, 42 y.o.   MRN: [id number]  HPI Here for several things. He is for a cpx and he is fasting for labs. Also he has had one week of sinus pressure, PND, HA, chest congesiton and coughing up green sputum. No fever. Also his gout has flared up in the left great toe for several days.    Review of Systems  Constitutional: Negative.   HENT: Positive for congestion, postnasal drip and sinus pressure. Negative for hearing loss, ear pain, facial swelling and neck pain.   Eyes: Negative.   Respiratory: Positive for cough and chest tightness. Negative for shortness of breath and wheezing.   Cardiovascular: Negative.   Gastrointestinal: Negative.   Genitourinary: Negative.   Musculoskeletal: Negative.   Skin: Negative.   Neurological: Negative.   Psychiatric/Behavioral: Negative.        Objective:   Physical Exam  Constitutional: He is oriented to person, place, and time. He appears well-developed and well-nourished. No distress.  HENT:  Head: Normocephalic and atraumatic.  Right Ear: External ear normal.  Left Ear: External ear normal.  Nose: Nose normal.  Mouth/Throat: Oropharynx is clear and moist. No oropharyngeal exudate.  Eyes: Conjunctivae and EOM are normal. Pupils are equal, round, and reactive to light. Right eye exhibits no discharge. Left eye exhibits no discharge. No scleral icterus.  Neck: Neck supple. No JVD present. No tracheal deviation present. No thyromegaly present.  Cardiovascular: Normal rate, regular rhythm, normal heart sounds and intact distal pulses.  Exam reveals no gallop and no friction rub.   No murmur heard. Pulmonary/Chest: Effort normal. No respiratory distress. He has no wheezes. He has no rales. He exhibits no tenderness.  Diffuse rhonchi   Abdominal: Soft. Bowel sounds are normal. He exhibits no distension and no mass. There is no tenderness. There is no rebound and no guarding.   Genitourinary: Rectum normal, prostate normal and penis normal. Guaiac negative stool. No penile tenderness.  Musculoskeletal: Normal range of motion. He exhibits no edema and no tenderness.  Lymphadenopathy:    He has no cervical adenopathy.  Neurological: He is alert and oriented to person, place, and time. He has normal reflexes. No cranial nerve deficit. He exhibits normal muscle tone. Coordination normal.  Skin: Skin is warm and dry. No rash noted. He is not diaphoretic. No erythema. No pallor.  Psychiatric: He has a normal mood and affect. His behavior is normal. Judgment and thought content normal.          Assessment & Plan:  For the well exam, get labs today. Recheck a TSH. Given a steroid shot for the sinusitis and bronchitis, and this should help the gout in his toe as well.

## 2012-09-23 NOTE — Telephone Encounter (Signed)
Tonya from [facility] called to advised that pt's WBC was 17.9 and pt's platelets were clumpy.  Per Archie Patten every time pt has a CBC pt's lab work has to be sent out.  Per Dr. Clent Ridges okay to send pt's lab work to [facility].

## 2012-09-23 NOTE — Addendum Note (Signed)
Addended by: Aniceto Boss A on: 09/23/2012 10:42 AM   Modules accepted: Orders

## 2012-09-24 ENCOUNTER — Telehealth: Payer: Self-pay | Admitting: Family Medicine

## 2012-09-24 LAB — CBC WITH DIFFERENTIAL/PLATELET
Basophils Relative: 1 % (ref 0–1)
HCT: 45 % (ref 39.0–52.0)
Hemoglobin: 15.7 g/dL (ref 13.0–17.0)
Lymphs Abs: 3.4 10*3/uL (ref 0.7–4.0)
MCH: 32.1 pg (ref 26.0–34.0)
MCHC: 34.9 g/dL (ref 30.0–36.0)
Monocytes Absolute: 1.4 10*3/uL — ABNORMAL HIGH (ref 0.1–1.0)
Monocytes Relative: 9 % (ref 3–12)
Neutro Abs: 10.2 10*3/uL — ABNORMAL HIGH (ref 1.7–7.7)
Neutrophils Relative %: 67 % (ref 43–77)
RBC: 4.89 MIL/uL (ref 4.22–5.81)

## 2012-09-24 MED ORDER — HYDROCODONE-ACETAMINOPHEN 10-325 MG PO TABS: 1.0000 | ORAL_TABLET | Freq: Four times a day (QID) | ORAL | Status: DC | PRN

## 2012-09-24 NOTE — Telephone Encounter (Signed)
Call in Vicodin 10/325 to take q 6 hours prn pain, #60 with no rf.

## 2012-09-24 NOTE — Telephone Encounter (Signed)
rx called in

## 2012-09-24 NOTE — Telephone Encounter (Signed)
Pt feels like his gout is worse. He can barely walk. Pt is trying to work, and would like you to call him something else. [facility] [address] [facility] this time. It is near his work)

## 2012-09-27 ENCOUNTER — Other Ambulatory Visit: Payer: Self-pay

## 2013-02-22 ENCOUNTER — Other Ambulatory Visit: Payer: Self-pay | Admitting: Family Medicine

## 2013-05-04 ENCOUNTER — Other Ambulatory Visit: Payer: Self-pay | Admitting: Family Medicine

## 2013-05-04 ENCOUNTER — Telehealth: Payer: Self-pay | Admitting: Family Medicine

## 2013-05-04 DIAGNOSIS — E039 Hypothyroidism, unspecified: Secondary | ICD-10-CM

## 2013-05-04 NOTE — Telephone Encounter (Signed)
Per Dr. Clent Ridges, okay to order a TSH and I did put future lab order in computer. Can you call pt to schedule the lab appointment?

## 2013-05-04 NOTE — Telephone Encounter (Signed)
Pt states that he would like to come in for labs tomorrow in regards to his SYNTHROID 150 MCG tablet. He states that he feels his dosage needs altered. Please advise.

## 2013-05-05 ENCOUNTER — Other Ambulatory Visit (INDEPENDENT_AMBULATORY_CARE_PROVIDER_SITE_OTHER): Payer: BC Managed Care – PPO

## 2013-05-05 DIAGNOSIS — E039 Hypothyroidism, unspecified: Secondary | ICD-10-CM

## 2013-05-05 NOTE — Telephone Encounter (Signed)
Scheduled

## 2013-05-11 MED ORDER — LEVOTHYROXINE SODIUM 200 MCG PO TABS: 200.0000 ug | ORAL_TABLET | Freq: Every day | ORAL | Status: DC

## 2013-05-11 NOTE — Progress Notes (Signed)
Quick Note:  I spoke with pt, sent new script e-scribe and also released results in my chart. ______

## 2013-06-18 ENCOUNTER — Other Ambulatory Visit: Payer: Self-pay

## 2013-11-24 ENCOUNTER — Encounter: Payer: Self-pay | Admitting: Family Medicine

## 2013-11-24 ENCOUNTER — Ambulatory Visit (INDEPENDENT_AMBULATORY_CARE_PROVIDER_SITE_OTHER): Payer: BC Managed Care – PPO | Admitting: Family Medicine

## 2013-11-24 VITALS — BP 140/86 | HR 83 | Temp 99.0°F | Wt 329.0 lb

## 2013-11-24 DIAGNOSIS — E039 Hypothyroidism, unspecified: Secondary | ICD-10-CM

## 2013-11-24 DIAGNOSIS — E785 Hyperlipidemia, unspecified: Secondary | ICD-10-CM

## 2013-11-24 DIAGNOSIS — M2550 Pain in unspecified joint: Secondary | ICD-10-CM

## 2013-11-24 DIAGNOSIS — R609 Edema, unspecified: Secondary | ICD-10-CM

## 2013-11-24 DIAGNOSIS — M109 Gout, unspecified: Secondary | ICD-10-CM

## 2013-11-24 LAB — HEPATIC FUNCTION PANEL
ALK PHOS: 52 U/L (ref 39–117)
ALT: 74 U/L — AB (ref 0–53)
AST: 64 U/L — ABNORMAL HIGH (ref 0–37)
Albumin: 4.1 g/dL (ref 3.5–5.2)
BILIRUBIN DIRECT: 0.1 mg/dL (ref 0.0–0.3)
TOTAL PROTEIN: 8.2 g/dL (ref 6.0–8.3)
Total Bilirubin: 0.9 mg/dL (ref 0.3–1.2)

## 2013-11-24 LAB — BASIC METABOLIC PANEL
BUN: 13 mg/dL (ref 6–23)
CHLORIDE: 101 meq/L (ref 96–112)
CO2: 28 meq/L (ref 19–32)
Calcium: 9.7 mg/dL (ref 8.4–10.5)
Creatinine, Ser: 1.3 mg/dL (ref 0.4–1.5)
GFR: 62.32 mL/min (ref >60.00)
Glucose, Bld: 93 mg/dL (ref 70–99)
Potassium: 4 mEq/L (ref 3.5–5.1)
SODIUM: 140 meq/L (ref 135–145)

## 2013-11-24 LAB — C-REACTIVE PROTEIN: CRP: <0.5 mg/dL (ref 0.5–20.0)

## 2013-11-24 LAB — LIPID PANEL
CHOLESTEROL: 208 mg/dL — AB (ref 0–200)
HDL: 33.8 mg/dL — ABNORMAL LOW (ref >39.00)
LDL CALC: 139 mg/dL — AB (ref 0–99)
TRIGLYCERIDES: 175 mg/dL — AB (ref 0.0–149.0)
Total CHOL/HDL Ratio: 6
VLDL: 35 mg/dL (ref 0.0–40.0)

## 2013-11-24 LAB — RHEUMATOID FACTOR: Rheumatoid fact SerPl-aCnc: <10 [IU]/mL (ref <=14)

## 2013-11-24 LAB — URIC ACID: Uric Acid, Serum: 8.7 mg/dL — ABNORMAL HIGH (ref 4.0–7.8)

## 2013-11-24 LAB — TSH: TSH: 2.37 u[IU]/mL (ref 0.35–5.50)

## 2013-11-24 LAB — BRAIN NATRIURETIC PEPTIDE: Pro B Natriuretic peptide (BNP): 44 pg/mL (ref 0.0–100.0)

## 2013-11-24 LAB — T3, FREE: T3, Free: 3 pg/mL (ref 2.3–4.2)

## 2013-11-24 LAB — SEDIMENTATION RATE: Sed Rate: 25 mm/hr — ABNORMAL HIGH (ref 0–22)

## 2013-11-24 LAB — T4, FREE: Free T4: 1.07 ng/dL (ref 0.60–1.60)

## 2013-11-24 MED ORDER — FUROSEMIDE 20 MG PO TABS: 20.0000 mg | ORAL_TABLET | Freq: Every day | ORAL | Status: DC

## 2013-11-24 MED ORDER — OMEPRAZOLE 40 MG PO CPDR: 40.0000 mg | DELAYED_RELEASE_CAPSULE | Freq: Every day | ORAL | Status: DC

## 2013-11-24 NOTE — Progress Notes (Signed)
Pre visit review using our clinic review tool, if applicable. No additional management support is needed unless otherwise documented below in the visit note. 

## 2013-11-24 NOTE — Progress Notes (Signed)
   Subjective:    Patient ID: Matthew Reese, male    DOB: May 31, 1971, 43 y.o.   MRN: [id number]  HPI Here for a number of issues. He has had intermittent swelling in both legs and feet for several months. He feels SOB at times but denies any chest pains. He has frequent epigastric burning pains which do not respond to Zantac. He has a hx of ulcers. We last increased his Synthroid dose last fall but his level has not been checked since then. He describes generalized stiffness and pain in his joints, especially the feet, ankles, knees, hips, and shoulders.    Review of Systems  Constitutional: Positive for fatigue. Negative for fever, activity change, appetite change and unexpected weight change.  HENT: Negative.   Eyes: Negative.   Respiratory: Positive for shortness of breath. Negative for cough, chest tightness and wheezing.   Cardiovascular: Positive for leg swelling. Negative for chest pain and palpitations.  Endocrine: Negative.   Musculoskeletal: Positive for arthralgias and joint swelling. Negative for myalgias.  Neurological: Negative.        Objective:   Physical Exam  Constitutional: He is oriented to person, place, and time. He appears well-developed and well-nourished.  Neck: Neck supple. No thyromegaly present.  Cardiovascular: Normal rate, regular rhythm, normal heart sounds and intact distal pulses.   Pulmonary/Chest: Effort normal and breath sounds normal. No respiratory distress. He has no wheezes. He has no rales.  Abdominal: Soft. Bowel sounds are normal. He exhibits no distension and no mass. There is no rebound and no guarding.  Mildly tender in the epigastrium   Musculoskeletal: Normal range of motion. He exhibits no edema and no tenderness.  Lymphadenopathy:    He has no cervical adenopathy.  Neurological: He is alert and oriented to person, place, and time.          Assessment & Plan:  He seems to have some gastritis again so we will start back on  Omeprazole. He seems to be retaining some fluid though there is no evidence of this on exam today. Try Lasix daily. Get labs today to check a thyroid panel, BMET, etc. Also check some arthritis labs, since there seems to be more than just gout involved.

## 2013-11-26 LAB — CBC WITH DIFFERENTIAL/PLATELET
BASOS ABS: 0.1 10*3/uL (ref 0.0–0.1)
Basophils Relative: 1 % (ref 0.0–3.0)
EOS ABS: 0.3 10*3/uL (ref 0.0–0.7)
Eosinophils Relative: 2.7 % (ref 0.0–5.0)
HEMATOCRIT: 46.8 % (ref 39.0–52.0)
HEMOGLOBIN: 15.8 g/dL (ref 13.0–17.0)
LYMPHS ABS: 4 10*3/uL (ref 0.7–4.0)
Lymphocytes Relative: 39.1 % (ref 12.0–46.0)
MCHC: 33.8 g/dL (ref 30.0–36.0)
MCV: 92.4 fl (ref 78.0–100.0)
MONO ABS: 0.8 10*3/uL (ref 0.1–1.0)
Monocytes Relative: 7.7 % (ref 3.0–12.0)
NEUTROS ABS: 5.1 10*3/uL (ref 1.4–7.7)
Neutrophils Relative %: 49.5 % (ref 43.0–77.0)
PLATELETS: 193 10*3/uL (ref 150.0–400.0)
RBC: 5.07 Mil/uL (ref 4.22–5.81)
RDW: 12.6 % (ref 11.5–14.6)
WBC: 10.3 10*3/uL (ref 4.5–10.5)

## 2013-11-30 ENCOUNTER — Telehealth: Payer: Self-pay | Admitting: Family Medicine

## 2013-11-30 DIAGNOSIS — M25552 Pain in left hip: Principal | ICD-10-CM

## 2013-11-30 DIAGNOSIS — M25551 Pain in right hip: Secondary | ICD-10-CM

## 2013-11-30 NOTE — Telephone Encounter (Signed)
Referral was done  

## 2013-11-30 NOTE — Telephone Encounter (Signed)
Pt needs referral to Ortho for hip pain.

## 2013-12-01 ENCOUNTER — Encounter: Payer: Self-pay | Admitting: Family Medicine

## 2013-12-01 NOTE — Telephone Encounter (Signed)
I spoke with pt  

## 2014-05-03 ENCOUNTER — Other Ambulatory Visit: Payer: Self-pay | Admitting: Family Medicine

## 2014-06-29 ENCOUNTER — Other Ambulatory Visit: Payer: Self-pay | Admitting: Family Medicine

## 2014-07-25 ENCOUNTER — Other Ambulatory Visit: Payer: Self-pay | Admitting: Family Medicine

## 2014-10-05 ENCOUNTER — Other Ambulatory Visit: Payer: Self-pay | Admitting: Orthopedic Surgery

## 2014-10-05 DIAGNOSIS — S62102B Fracture of unspecified carpal bone, left wrist, initial encounter for open fracture: Secondary | ICD-10-CM

## 2014-10-05 DIAGNOSIS — Z139 Encounter for screening, unspecified: Secondary | ICD-10-CM

## 2014-10-05 DIAGNOSIS — M25532 Pain in left wrist: Secondary | ICD-10-CM

## 2014-10-18 ENCOUNTER — Other Ambulatory Visit: Payer: Self-pay | Admitting: Family Medicine

## 2014-11-02 ENCOUNTER — Other Ambulatory Visit: Payer: Self-pay | Admitting: Family Medicine

## 2014-11-11 ENCOUNTER — Encounter: Payer: Self-pay | Admitting: Family Medicine

## 2014-11-16 ENCOUNTER — Other Ambulatory Visit: Payer: Self-pay | Admitting: Family Medicine

## 2014-11-16 MED ORDER — INDOMETHACIN 50 MG PO CAPS: 50.0000 mg | ORAL_CAPSULE | Freq: Three times a day (TID) | ORAL | Status: DC | PRN

## 2014-11-16 NOTE — Telephone Encounter (Signed)
I sent script e-scribe and pt a message in my chart.

## 2014-11-16 NOTE — Telephone Encounter (Signed)
Per Dr. Sarajane Jews change to Indomethacin 50 mg take 1 po tid prn # 90 with 5 refills.

## 2014-11-16 NOTE — Telephone Encounter (Signed)
Call in Indomethacin 75 mg to take every 6 hours prn gout, #60 with 5 rf

## 2014-12-07 ENCOUNTER — Other Ambulatory Visit (INDEPENDENT_AMBULATORY_CARE_PROVIDER_SITE_OTHER): Payer: BC Managed Care – PPO

## 2014-12-07 ENCOUNTER — Other Ambulatory Visit: Payer: Self-pay | Admitting: Family Medicine

## 2014-12-07 DIAGNOSIS — Z0000 Encounter for general adult medical examination without abnormal findings: Secondary | ICD-10-CM

## 2014-12-07 LAB — POCT URINALYSIS DIPSTICK
Bilirubin, UA: NEGATIVE
GLUCOSE UA: NEGATIVE
KETONES UA: NEGATIVE
LEUKOCYTES UA: NEGATIVE
Nitrite, UA: NEGATIVE
Protein, UA: NEGATIVE
SPEC GRAV UA: 1.015
Urobilinogen, UA: 0.2
pH, UA: 5.5

## 2014-12-07 LAB — COMPREHENSIVE METABOLIC PANEL
ALK PHOS: 57 U/L (ref 39–117)
ALT: 80 U/L — AB (ref 0–53)
AST: 72 U/L — ABNORMAL HIGH (ref 0–37)
Albumin: 4.4 g/dL (ref 3.5–5.2)
BUN: 13 mg/dL (ref 6–23)
CO2: 29 meq/L (ref 19–32)
Calcium: 10 mg/dL (ref 8.4–10.5)
Chloride: 102 mEq/L (ref 96–112)
Creatinine, Ser: 1.31 mg/dL (ref 0.40–1.50)
GFR: 62.58 mL/min (ref >60.00)
GLUCOSE: 94 mg/dL (ref 70–99)
Potassium: 4.7 mEq/L (ref 3.5–5.1)
Sodium: 138 mEq/L (ref 135–145)
Total Bilirubin: 0.8 mg/dL (ref 0.2–1.2)
Total Protein: 7.7 g/dL (ref 6.0–8.3)

## 2014-12-07 LAB — LIPID PANEL
CHOLESTEROL: 192 mg/dL (ref 0–200)
HDL: 33.1 mg/dL — ABNORMAL LOW (ref >39.00)
LDL Cholesterol: 126 mg/dL — ABNORMAL HIGH (ref 0–99)
NONHDL: 158.9
TRIGLYCERIDES: 167 mg/dL — AB (ref 0.0–149.0)
Total CHOL/HDL Ratio: 6
VLDL: 33.4 mg/dL (ref 0.0–40.0)

## 2014-12-07 LAB — TSH: TSH: 1.43 u[IU]/mL (ref 0.35–4.50)

## 2014-12-07 NOTE — Addendum Note (Signed)
Addended by: Joyce Gross R on: 12/07/2014 05:06 PM   Modules accepted: Orders

## 2014-12-08 LAB — CBC WITH DIFFERENTIAL/PLATELET
Basophils Absolute: 0.2 10*3/uL — ABNORMAL HIGH (ref 0.0–0.1)
Basophils Relative: 1.1 % (ref 0.0–3.0)
EOS ABS: 0.4 10*3/uL (ref 0.0–0.7)
Eosinophils Relative: 3.1 % (ref 0.0–5.0)
HCT: 43.7 % (ref 39.0–52.0)
HEMOGLOBIN: 15.2 g/dL (ref 13.0–17.0)
Lymphocytes Relative: 35.1 % (ref 12.0–46.0)
Lymphs Abs: 4.9 10*3/uL — ABNORMAL HIGH (ref 0.7–4.0)
MCHC: 34.8 g/dL (ref 30.0–36.0)
MCV: 90.4 fl (ref 78.0–100.0)
MONOS PCT: 7.1 % (ref 3.0–12.0)
Monocytes Absolute: 1 10*3/uL (ref 0.1–1.0)
NEUTROS ABS: 7.5 10*3/uL (ref 1.4–7.7)
Neutrophils Relative %: 53.6 % (ref 43.0–77.0)
Platelets: 150 10*3/uL (ref 150.0–400.0)
RBC: 4.83 Mil/uL (ref 4.22–5.81)
RDW: 13.4 % (ref 11.5–15.5)
WBC: 14.1 10*3/uL — ABNORMAL HIGH (ref 4.0–10.5)

## 2014-12-09 NOTE — Telephone Encounter (Signed)
Pt has CPE in May 2016 with Dr. Sarajane Jews. I will send in a 30 day supply.

## 2014-12-14 ENCOUNTER — Ambulatory Visit (INDEPENDENT_AMBULATORY_CARE_PROVIDER_SITE_OTHER): Payer: BC Managed Care – PPO | Admitting: Family Medicine

## 2014-12-14 ENCOUNTER — Encounter: Payer: Self-pay | Admitting: Family Medicine

## 2014-12-14 VITALS — BP 141/86 | HR 84 | Temp 99.4°F | Ht >= 80 in | Wt 329.0 lb

## 2014-12-14 DIAGNOSIS — Z0000 Encounter for general adult medical examination without abnormal findings: Secondary | ICD-10-CM | POA: Diagnosis not present

## 2014-12-14 MED ORDER — OMEPRAZOLE 40 MG PO CPDR: 40.0000 mg | DELAYED_RELEASE_CAPSULE | Freq: Every day | ORAL | Status: DC

## 2014-12-14 MED ORDER — LEVOTHYROXINE SODIUM 200 MCG PO TABS: ORAL_TABLET | ORAL | Status: DC

## 2014-12-14 NOTE — Progress Notes (Signed)
   Subjective:    Patient ID: Matthew Reese, male    DOB: [birth year], 44 y.o.   MRN: [id number]  HPI 44 yr old male for a cpx. He feels well. He recently had a bout of gout in the foot but this responded well to Indocin.    Review of Systems  Constitutional: Negative.   HENT: Negative.   Eyes: Negative.   Respiratory: Negative.   Cardiovascular: Negative.   Gastrointestinal: Negative.   Genitourinary: Negative.   Musculoskeletal: Negative.   Skin: Negative.   Neurological: Negative.   Psychiatric/Behavioral: Negative.        Objective:   Physical Exam  Constitutional: He is oriented to person, place, and time. He appears well-developed and well-nourished. No distress.  HENT:  Head: Normocephalic and atraumatic.  Right Ear: External ear normal.  Left Ear: External ear normal.  Nose: Nose normal.  Mouth/Throat: Oropharynx is clear and moist. No oropharyngeal exudate.  Eyes: Conjunctivae and EOM are normal. Pupils are equal, round, and reactive to light. Right eye exhibits no discharge. Left eye exhibits no discharge. No scleral icterus.  Neck: Neck supple. No JVD present. No tracheal deviation present. No thyromegaly present.  Cardiovascular: Normal rate, regular rhythm, normal heart sounds and intact distal pulses.  Exam reveals no gallop and no friction rub.   No murmur heard. Pulmonary/Chest: Effort normal and breath sounds normal. No respiratory distress. He has no wheezes. He has no rales. He exhibits no tenderness.  Abdominal: Soft. Bowel sounds are normal. He exhibits no distension and no mass. There is no tenderness. There is no rebound and no guarding.  Genitourinary: Rectum normal, prostate normal and penis normal. Guaiac negative stool. No penile tenderness.  Musculoskeletal: Normal range of motion. He exhibits no edema or tenderness.  Lymphadenopathy:    He has no cervical adenopathy.  Neurological: He is alert and oriented to person, place, and time. He has  normal reflexes. No cranial nerve deficit. He exhibits normal muscle tone. Coordination normal.  Skin: Skin is warm and dry. No rash noted. He is not diaphoretic. No erythema. No pallor.  Psychiatric: He has a normal mood and affect. His behavior is normal. Judgment and thought content normal.          Assessment & Plan:  Well exam. Recheck prn

## 2014-12-14 NOTE — Progress Notes (Signed)
Pre visit review using our clinic review tool, if applicable. No additional management support is needed unless otherwise documented below in the visit note. 

## 2014-12-27 ENCOUNTER — Other Ambulatory Visit: Payer: Self-pay | Admitting: Family Medicine

## 2015-02-23 ENCOUNTER — Other Ambulatory Visit: Payer: Self-pay | Admitting: Family Medicine

## 2015-06-03 ENCOUNTER — Encounter: Payer: Self-pay | Admitting: Family Medicine

## 2015-06-03 ENCOUNTER — Ambulatory Visit (INDEPENDENT_AMBULATORY_CARE_PROVIDER_SITE_OTHER): Payer: BC Managed Care – PPO | Admitting: Family Medicine

## 2015-06-03 VITALS — BP 120/90 | HR 60 | Temp 98.5°F | Wt 330.0 lb

## 2015-06-03 DIAGNOSIS — M542 Cervicalgia: Secondary | ICD-10-CM | POA: Diagnosis not present

## 2015-06-03 MED ORDER — HYDROCODONE-ACETAMINOPHEN 10-325 MG PO TABS: 1.0000 | ORAL_TABLET | Freq: Four times a day (QID) | ORAL | Status: DC | PRN

## 2015-06-03 NOTE — Progress Notes (Signed)
   Subjective:    Patient ID: Matthew Reese, male    DOB: 05-Sep-1970, 44 y.o.   MRN: [id number]  HPI Here for 2 weeks of sharp pains in the posterior neck and right upper back that radiate down the right arm to the hand. The hand and all 5 fingers feel numb at times. His grip strength is weak and it is difficult to write with this hand. He has tried Indomethacin and Ibuprofen with no relief. No recent trauma.    Review of Systems  Respiratory: Negative.   Cardiovascular: Negative.   Musculoskeletal: Positive for neck pain and neck stiffness.  Neurological: Positive for weakness and numbness.       Objective:   Physical Exam  Constitutional: He is oriented to person, place, and time. He appears well-developed and well-nourished.  Holds his neck stiffly   Cardiovascular: Normal rate, regular rhythm, normal heart sounds and intact distal pulses.   Pulmonary/Chest: Effort normal and breath sounds normal.  Musculoskeletal:  He is tender over the lower posterior neck in the region of C5 to C7. ROM is limited by pain  Neurological: He is alert and oriented to person, place, and time. He has normal reflexes. No cranial nerve deficit. He exhibits normal muscle tone. Coordination normal.          Assessment & Plan:  Neck pain with right radicular symptoms. Use Norco for pain. Set up a cervical spine MRI.

## 2015-06-03 NOTE — Progress Notes (Signed)
Pre visit review using our clinic review tool, if applicable. No additional management support is needed unless otherwise documented below in the visit note. Influenza immunization was not given due to patient will get at work.

## 2015-06-06 ENCOUNTER — Encounter: Payer: Self-pay | Admitting: Family Medicine

## 2015-06-06 ENCOUNTER — Other Ambulatory Visit: Payer: Self-pay | Admitting: Family Medicine

## 2015-06-07 MED ORDER — OMEPRAZOLE 40 MG PO CPDR: 40.0000 mg | DELAYED_RELEASE_CAPSULE | Freq: Every day | ORAL | Status: DC

## 2015-06-07 NOTE — Addendum Note (Signed)
Addended by: Aggie Hacker A on: 06/07/2015 05:34 PM   Modules accepted: Orders

## 2015-06-08 ENCOUNTER — Telehealth: Payer: Self-pay | Admitting: Family Medicine

## 2015-06-08 NOTE — Telephone Encounter (Signed)
error 

## 2015-06-09 NOTE — Addendum Note (Signed)
Addended by: Alysia Penna A on: 06/09/2015 09:07 AM   Modules accepted: Orders

## 2015-06-29 ENCOUNTER — Telehealth: Payer: Self-pay | Admitting: Family Medicine

## 2015-07-01 MED ORDER — HYDROCODONE-ACETAMINOPHEN 10-325 MG PO TABS: 1.0000 | ORAL_TABLET | Freq: Four times a day (QID) | ORAL | Status: DC | PRN

## 2015-07-01 NOTE — Addendum Note (Signed)
Addended by: Alysia Penna A on: 07/01/2015 05:23 PM   Modules accepted: Orders

## 2015-07-01 NOTE — Telephone Encounter (Signed)
done

## 2015-07-04 NOTE — Telephone Encounter (Signed)
Script is ready for pick up and I sent pt a my chart message.  

## 2015-08-30 ENCOUNTER — Ambulatory Visit (INDEPENDENT_AMBULATORY_CARE_PROVIDER_SITE_OTHER): Payer: BC Managed Care – PPO | Admitting: Family Medicine

## 2015-08-30 ENCOUNTER — Encounter: Payer: Self-pay | Admitting: Family Medicine

## 2015-08-30 VITALS — BP 152/88 | HR 89 | Temp 99.2°F | Ht >= 80 in | Wt 322.0 lb

## 2015-08-30 DIAGNOSIS — M109 Gout, unspecified: Secondary | ICD-10-CM | POA: Diagnosis not present

## 2015-08-30 MED ORDER — METHYLPREDNISOLONE ACETATE 80 MG/ML IJ SUSP
160.0000 mg | Freq: Once | INTRAMUSCULAR | Status: AC
  Administered 2015-08-30: 160 mg via INTRAMUSCULAR

## 2015-08-30 MED ORDER — IBUPROFEN 800 MG PO TABS: 800.0000 mg | ORAL_TABLET | Freq: Three times a day (TID) | ORAL | Status: DC | PRN

## 2015-08-30 MED ORDER — PREDNISONE 10 MG PO TABS: ORAL_TABLET | ORAL | Status: DC

## 2015-08-30 NOTE — Progress Notes (Signed)
Pre visit review using our clinic review tool, if applicable. No additional management support is needed unless otherwise documented below in the visit note. 

## 2015-08-30 NOTE — Progress Notes (Signed)
   Subjective:    Patient ID: Matthew Reese, male    DOB: Dec 17, 1970, 45 y.o.   MRN: [id number]  HPI Here for 4 days of swelling and pain in the right foot and ankle. No recent trauma. Taking Indomethacin TID.    Review of Systems  Constitutional: Negative.   Musculoskeletal: Positive for joint swelling and arthralgias.       Objective:   Physical Exam  Constitutional: He appears well-developed and well-nourished.  In pain  Musculoskeletal:  Right foot and lateral ankle are red, warm, swollen, and very tender           Assessment & Plan:  Gout, given a steroid shot to be followed by a prednisone taper for 15 days. Add a Norco prn. Written out of work today until 09-02-15.

## 2015-08-30 NOTE — Addendum Note (Signed)
Addended by: Aggie Hacker A on: 08/30/2015 04:31 PM   Modules accepted: Orders

## 2015-12-19 ENCOUNTER — Other Ambulatory Visit: Payer: Self-pay | Admitting: Family Medicine

## 2016-02-15 ENCOUNTER — Other Ambulatory Visit: Payer: Self-pay | Admitting: Family Medicine

## 2016-04-14 ENCOUNTER — Other Ambulatory Visit: Payer: Self-pay | Admitting: Family Medicine

## 2016-04-18 ENCOUNTER — Encounter: Payer: Self-pay | Admitting: Family Medicine

## 2016-04-18 ENCOUNTER — Ambulatory Visit (INDEPENDENT_AMBULATORY_CARE_PROVIDER_SITE_OTHER): Payer: BC Managed Care – PPO | Admitting: Family Medicine

## 2016-04-18 VITALS — BP 126/70 | Temp 98.8°F | Ht >= 80 in | Wt 329.0 lb

## 2016-04-18 DIAGNOSIS — E785 Hyperlipidemia, unspecified: Secondary | ICD-10-CM

## 2016-04-18 DIAGNOSIS — E039 Hypothyroidism, unspecified: Secondary | ICD-10-CM | POA: Diagnosis not present

## 2016-04-18 DIAGNOSIS — M109 Gout, unspecified: Secondary | ICD-10-CM | POA: Diagnosis not present

## 2016-04-18 DIAGNOSIS — Z23 Encounter for immunization: Secondary | ICD-10-CM | POA: Diagnosis not present

## 2016-04-18 LAB — HEPATIC FUNCTION PANEL
ALBUMIN: 4.4 g/dL (ref 3.5–5.2)
ALT: 63 U/L — ABNORMAL HIGH (ref 0–53)
AST: 44 U/L — ABNORMAL HIGH (ref 0–37)
Alkaline Phosphatase: 49 U/L (ref 39–117)
BILIRUBIN DIRECT: 0.2 mg/dL (ref 0.0–0.3)
TOTAL PROTEIN: 7.4 g/dL (ref 6.0–8.3)
Total Bilirubin: 0.7 mg/dL (ref 0.2–1.2)

## 2016-04-18 LAB — LIPID PANEL
CHOL/HDL RATIO: 6
Cholesterol: 222 mg/dL — ABNORMAL HIGH (ref 0–200)
HDL: 36.4 mg/dL — AB (ref >39.00)
LDL CALC: 149 mg/dL — AB (ref 0–99)
NonHDL: 186.07
TRIGLYCERIDES: 186 mg/dL — AB (ref 0.0–149.0)
VLDL: 37.2 mg/dL (ref 0.0–40.0)

## 2016-04-18 LAB — POC URINALSYSI DIPSTICK (AUTOMATED)
Bilirubin, UA: NEGATIVE
Glucose, UA: NEGATIVE
KETONES UA: NEGATIVE
Leukocytes, UA: NEGATIVE
Nitrite, UA: NEGATIVE
PH UA: 5.5
PROTEIN UA: NEGATIVE
RBC UA: NEGATIVE
SPEC GRAV UA: 1.025
UROBILINOGEN UA: 0.2

## 2016-04-18 LAB — CBC WITH DIFFERENTIAL/PLATELET
BASOS ABS: 0.1 10*3/uL (ref 0.0–0.1)
BASOS PCT: 0.9 % (ref 0.0–3.0)
EOS PCT: 2.7 % (ref 0.0–5.0)
Eosinophils Absolute: 0.2 10*3/uL (ref 0.0–0.7)
HEMATOCRIT: 47.7 % (ref 39.0–52.0)
Hemoglobin: 16.3 g/dL (ref 13.0–17.0)
Lymphocytes Relative: 39.5 % (ref 12.0–46.0)
Lymphs Abs: 3.5 10*3/uL (ref 0.7–4.0)
MCHC: 34.1 g/dL (ref 30.0–36.0)
MCV: 89.8 fl (ref 78.0–100.0)
MONOS PCT: 7.1 % (ref 3.0–12.0)
Monocytes Absolute: 0.6 10*3/uL (ref 0.1–1.0)
NEUTROS ABS: 4.4 10*3/uL (ref 1.4–7.7)
NEUTROS PCT: 49.8 % (ref 43.0–77.0)
RBC: 5.32 Mil/uL (ref 4.22–5.81)
RDW: 13.1 % (ref 11.5–15.5)
WBC: 8.9 10*3/uL (ref 4.0–10.5)

## 2016-04-18 LAB — BASIC METABOLIC PANEL
BUN: 16 mg/dL (ref 6–23)
CALCIUM: 9.3 mg/dL (ref 8.4–10.5)
CO2: 29 mEq/L (ref 19–32)
CREATININE: 1.32 mg/dL (ref 0.40–1.50)
Chloride: 101 mEq/L (ref 96–112)
GFR: 61.67 mL/min (ref >60.00)
Glucose, Bld: 103 mg/dL — ABNORMAL HIGH (ref 70–99)
Potassium: 4.5 mEq/L (ref 3.5–5.1)
SODIUM: 138 meq/L (ref 135–145)

## 2016-04-18 LAB — URIC ACID: Uric Acid, Serum: 7.8 mg/dL (ref 4.0–7.8)

## 2016-04-18 LAB — TSH: TSH: 3.25 u[IU]/mL (ref 0.35–4.50)

## 2016-04-18 LAB — T4, FREE: FREE T4: 0.99 ng/dL (ref 0.60–1.60)

## 2016-04-18 LAB — T3, FREE: T3, Free: 3.3 pg/mL (ref 2.3–4.2)

## 2016-04-18 MED ORDER — LEVOTHYROXINE SODIUM 200 MCG PO TABS: ORAL_TABLET | ORAL | 3 refills | Status: DC

## 2016-04-18 MED ORDER — HYDROCODONE-ACETAMINOPHEN 10-325 MG PO TABS: 1.0000 | ORAL_TABLET | Freq: Four times a day (QID) | ORAL | 0 refills | Status: DC | PRN

## 2016-04-18 MED ORDER — PREDNISONE 10 MG PO TABS: ORAL_TABLET | ORAL | 0 refills | Status: DC

## 2016-04-18 MED ORDER — IBUPROFEN 800 MG PO TABS: 800.0000 mg | ORAL_TABLET | Freq: Three times a day (TID) | ORAL | 5 refills | Status: DC | PRN

## 2016-04-18 NOTE — Progress Notes (Signed)
Pre visit review using our clinic review tool, if applicable. No additional management support is needed unless otherwise documented below in the visit note. 

## 2016-04-18 NOTE — Progress Notes (Signed)
   Subjective:    Patient ID: Matthew Reese, male    DOB: 03-01-1971, 45 y.o.   MRN: [id number]  HPI Here to follow up. He feels fine in general. His gout flares up at times. Sometimes he treat it with Ibuprofen, but he will use a steroid taper for more serious episodes.    Review of Systems  Constitutional: Negative.   Respiratory: Negative.   Cardiovascular: Negative.   Gastrointestinal: Negative.   Endocrine: Negative.   Musculoskeletal: Positive for arthralgias.  Neurological: Negative.        Objective:   Physical Exam  Constitutional: He is oriented to person, place, and time. He appears well-developed and well-nourished.  Neck: No thyromegaly present.  Cardiovascular: Normal rate, regular rhythm, normal heart sounds and intact distal pulses.   Pulmonary/Chest: Effort normal and breath sounds normal.  Lymphadenopathy:    He has no cervical adenopathy.  Neurological: He is alert and oriented to person, place, and time.          Assessment & Plan:  We will get a thyroid panel today to check his thyroid function. Get fasting lipids for the cholesterol level. Check a uric acid for the gout.  Laurey Morale, MD

## 2016-04-19 ENCOUNTER — Encounter: Payer: Self-pay | Admitting: Family Medicine

## 2016-04-19 NOTE — Telephone Encounter (Signed)
Actually I gave him a rx for 10-325 and this is clearly shown in the computer. If he got 5-325 that is because the pharmacy made a mistake. I suggest he speak to them

## 2016-04-29 ENCOUNTER — Other Ambulatory Visit: Payer: Self-pay | Admitting: Family Medicine

## 2016-05-14 ENCOUNTER — Ambulatory Visit (INDEPENDENT_AMBULATORY_CARE_PROVIDER_SITE_OTHER): Payer: BC Managed Care – PPO | Admitting: Family Medicine

## 2016-05-14 ENCOUNTER — Encounter: Payer: Self-pay | Admitting: Family Medicine

## 2016-05-14 VITALS — BP 130/88 | Temp 98.4°F | Ht >= 80 in | Wt 325.0 lb

## 2016-05-14 DIAGNOSIS — Z0000 Encounter for general adult medical examination without abnormal findings: Secondary | ICD-10-CM

## 2016-05-14 MED ORDER — OMEPRAZOLE 40 MG PO CPDR: 40.0000 mg | DELAYED_RELEASE_CAPSULE | Freq: Every day | ORAL | 3 refills | Status: DC

## 2016-05-14 NOTE — Progress Notes (Signed)
   Subjective:    Patient ID: Matthew Reese, male    DOB: 18-Jan-1971, 45 y.o.   MRN: [id number]  HPI 45 yr old male for a well exam. He is doing well with no concerns.    Review of Systems  Constitutional: Negative.   HENT: Negative.   Eyes: Negative.   Respiratory: Negative.   Cardiovascular: Negative.   Gastrointestinal: Negative.   Genitourinary: Negative.   Musculoskeletal: Negative.   Skin: Negative.   Neurological: Negative.   Psychiatric/Behavioral: Negative.        Objective:   Physical Exam  Constitutional: He is oriented to person, place, and time. He appears well-developed and well-nourished. No distress.  HENT:  Head: Normocephalic and atraumatic.  Right Ear: External ear normal.  Left Ear: External ear normal.  Nose: Nose normal.  Mouth/Throat: Oropharynx is clear and moist. No oropharyngeal exudate.  Eyes: Conjunctivae and EOM are normal. Pupils are equal, round, and reactive to light. Right eye exhibits no discharge. Left eye exhibits no discharge. No scleral icterus.  Neck: Neck supple. No JVD present. No tracheal deviation present. No thyromegaly present.  Cardiovascular: Normal rate, regular rhythm, normal heart sounds and intact distal pulses.  Exam reveals no gallop and no friction rub.   No murmur heard. Pulmonary/Chest: Effort normal and breath sounds normal. No respiratory distress. He has no wheezes. He has no rales. He exhibits no tenderness.  Abdominal: Soft. Bowel sounds are normal. He exhibits no distension and no mass. There is no tenderness. There is no rebound and no guarding.  Genitourinary: Rectum normal, prostate normal and penis normal. Rectal exam shows guaiac negative stool. No penile tenderness.  Musculoskeletal: Normal range of motion. He exhibits no edema or tenderness.  Lymphadenopathy:    He has no cervical adenopathy.  Neurological: He is alert and oriented to person, place, and time. He has normal reflexes. No cranial nerve  deficit. He exhibits normal muscle tone. Coordination normal.  Skin: Skin is warm and dry. No rash noted. He is not diaphoretic. No erythema. No pallor.  Psychiatric: He has a normal mood and affect. His behavior is normal. Judgment and thought content normal.          Assessment & Plan:  Well exam. We discussed diet and exercise. His LDL and glucose are both slightly high, so he will work on these.  Laurey Morale, MD

## 2016-05-14 NOTE — Progress Notes (Signed)
Pre visit review using our clinic review tool, if applicable. No additional management support is needed unless otherwise documented below in the visit note. 

## 2016-10-10 ENCOUNTER — Ambulatory Visit (INDEPENDENT_AMBULATORY_CARE_PROVIDER_SITE_OTHER): Payer: BC Managed Care – PPO | Admitting: Family Medicine

## 2016-10-10 ENCOUNTER — Encounter: Payer: Self-pay | Admitting: Family Medicine

## 2016-10-10 VITALS — BP 136/88 | Temp 98.7°F | Ht >= 80 in | Wt 324.0 lb

## 2016-10-10 DIAGNOSIS — J208 Acute bronchitis due to other specified organisms: Secondary | ICD-10-CM | POA: Diagnosis not present

## 2016-10-10 MED ORDER — AZITHROMYCIN 250 MG PO TABS: ORAL_TABLET | ORAL | 0 refills | Status: DC

## 2016-10-10 NOTE — Progress Notes (Signed)
Pre visit review using our clinic review tool, if applicable. No additional management support is needed unless otherwise documented below in the visit note. 

## 2016-10-10 NOTE — Patient Instructions (Signed)
**  Coming March 12th**  [facility]!!!  Same Day Appointments for Acute Care: Sprains, Injuries, cuts, abrasions Colds, flu, sore throats, cough, upset stomachs Fever, ear pain Sinus and eye infections Animal/insect bites  3 Easy Ways to Schedule: Walk-In Scheduling Call in scheduling Mychart Sign-up: [email or web address]         

## 2016-10-10 NOTE — Progress Notes (Signed)
   Subjective:    Patient ID: NICKALOUS BARBA, male    DOB: 06/13/1971, 46 y.o.   MRN: [id number]  HPI Here for 5 days of URI symptoms. He started with body aches and fevers to 100.5 degrees. These symptoms have resolved. However he has also developed chest congestion and a dry cough. Using Ibuprofen and Mucinex DM. His wife and daughter were recently diagnosed with the flu.    Review of Systems  Constitutional: Positive for fever.  HENT: Positive for congestion and sore throat. Negative for sinus pain and sinus pressure.   Eyes: Negative.   Respiratory: Positive for cough and chest tightness.   Cardiovascular: Negative.        Objective:   Physical Exam  Constitutional: He appears well-developed and well-nourished.  HENT:  Right Ear: External ear normal.  Left Ear: External ear normal.  Nose: Nose normal.  Mouth/Throat: Oropharynx is clear and moist.  Eyes: Conjunctivae are normal.  Neck: No thyromegaly present.  Pulmonary/Chest: Effort normal. No respiratory distress. He has no wheezes. He has no rales.  Scattered rhonchi   Lymphadenopathy:    He has no cervical adenopathy.          Assessment & Plan:  Bronchitis secondary to influenza. Treat with a Zpack. Written out of work 10-08-16 until 10-15-16.  Alysia Penna, MD

## 2016-10-11 ENCOUNTER — Encounter: Payer: Self-pay | Admitting: Family Medicine

## 2016-10-12 NOTE — Telephone Encounter (Signed)
Its probably just a side effect of the azithromycin. Use Imodium prn

## 2016-10-15 NOTE — Telephone Encounter (Signed)
Not really. I guess he should stay away from anything in the [place] family.

## 2016-10-15 NOTE — Telephone Encounter (Signed)
This is a duplicate, see previous note.  

## 2016-10-18 ENCOUNTER — Encounter: Payer: Self-pay | Admitting: Family Medicine

## 2016-10-19 ENCOUNTER — Encounter: Payer: Self-pay | Admitting: Family Medicine

## 2016-10-19 ENCOUNTER — Ambulatory Visit (INDEPENDENT_AMBULATORY_CARE_PROVIDER_SITE_OTHER): Payer: BC Managed Care – PPO | Admitting: Family Medicine

## 2016-10-19 VITALS — BP 130/90 | Temp 98.1°F | Ht >= 80 in | Wt 312.0 lb

## 2016-10-19 DIAGNOSIS — K529 Noninfective gastroenteritis and colitis, unspecified: Secondary | ICD-10-CM

## 2016-10-19 LAB — AMYLASE: Amylase: 39 U/L (ref 27–131)

## 2016-10-19 LAB — HEPATIC FUNCTION PANEL
ALK PHOS: 72 U/L (ref 39–117)
ALT: 171 U/L — ABNORMAL HIGH (ref 0–53)
AST: 87 U/L — ABNORMAL HIGH (ref 0–37)
Albumin: 4.8 g/dL (ref 3.5–5.2)
BILIRUBIN DIRECT: 0.4 mg/dL — AB (ref 0.0–0.3)
TOTAL PROTEIN: 8.3 g/dL (ref 6.0–8.3)
Total Bilirubin: 1.2 mg/dL (ref 0.2–1.2)

## 2016-10-19 LAB — BASIC METABOLIC PANEL
BUN: 18 mg/dL (ref 6–23)
CALCIUM: 10.1 mg/dL (ref 8.4–10.5)
CO2: 24 mEq/L (ref 19–32)
CREATININE: 1.58 mg/dL — AB (ref 0.40–1.50)
Chloride: 101 mEq/L (ref 96–112)
GFR: 50.01 mL/min — AB (ref >60.00)
Glucose, Bld: 98 mg/dL (ref 70–99)
Potassium: 4.5 mEq/L (ref 3.5–5.1)
Sodium: 135 mEq/L (ref 135–145)

## 2016-10-19 LAB — LIPASE: Lipase: 52 U/L (ref 11.0–59.0)

## 2016-10-19 MED ORDER — PROMETHAZINE HCL 25 MG PO TABS: 25.0000 mg | ORAL_TABLET | ORAL | 1 refills | Status: DC | PRN

## 2016-10-19 NOTE — Patient Instructions (Signed)
WE NOW OFFER   [facility] FAST TRACK!!!  SAME DAY Appointments for ACUTE CARE  Such as: Sprains, Injuries, cuts, abrasions, rashes, muscle pain, joint pain, back pain Colds, flu, sore throats, headache, allergies, cough, fever  Ear pain, sinus and eye infections Abdominal pain, nausea, vomiting, diarrhea, upset stomach Animal/insect bites  3 Easy Ways to Schedule: Walk-In Scheduling Call in scheduling Mychart Sign-up: [email or web address]         

## 2016-10-19 NOTE — Telephone Encounter (Signed)
Spoke with pt and he states that he is still sick. He agreed to schedule appt. Scheduled with Dr. Sarajane Jews for this afternoon. Pt aware. Nothing further needed.

## 2016-10-19 NOTE — Telephone Encounter (Signed)
LMTCB

## 2016-10-19 NOTE — Progress Notes (Signed)
Pre visit review using our clinic review tool, if applicable. No additional management support is needed unless otherwise documented below in the visit note. 

## 2016-10-19 NOTE — Progress Notes (Signed)
   Subjective:    Patient ID: Matthew Reese, male    DOB: 08-21-1970, 46 y.o.   MRN: [id number]  HPI Here for 5 days of generalized abdominal cramps, nausea with vomiting, and watery diarrhea. No obvious blood seen. No fever. No recent travel. He was seen here for a bronchitis on 10-10-16 and was prescribed a Zpack. After taking this for 3 days his nausea started so he never finished the Zpack. His coughing has resolved. He still takes Omeprazole daily, as he has for years.    Review of Systems  Constitutional: Positive for fatigue. Negative for chills, diaphoresis and fever.  HENT: Negative.   Eyes: Negative.   Respiratory: Negative.   Cardiovascular: Negative.   Gastrointestinal: Positive for abdominal pain, diarrhea, nausea and vomiting. Negative for abdominal distention, anal bleeding, blood in stool, constipation and rectal pain.  Genitourinary: Negative.        Objective:   Physical Exam  Constitutional: He is oriented to person, place, and time. He appears well-developed and well-nourished. No distress.  Neck: Neck supple. No thyromegaly present.  Cardiovascular: Normal rate, regular rhythm, normal heart sounds and intact distal pulses.   Pulmonary/Chest: Effort normal and breath sounds normal. No respiratory distress. He has no wheezes. He has no rales.  Abdominal: Soft. Bowel sounds are normal. He exhibits no distension and no mass. There is no rebound and no guarding.  Mild generalized tenderness   Lymphadenopathy:    He has no cervical adenopathy.  Neurological: He is alert and oriented to person, place, and time.          Assessment & Plan:  Enteritis, likely viral. Use Phenergan for nausea and Imodium AD for diarrhea. Drink fluids and try a BRAT diet. Get labs today. Written out of work from 10-16-16 until 10-22-16.  Alysia Penna, MD

## 2016-10-22 LAB — CBC WITH DIFFERENTIAL/PLATELET
BASOS ABS: 0.1 10*3/uL (ref 0.0–0.1)
Basophils Relative: 1 % (ref 0.0–3.0)
EOS ABS: 0.3 10*3/uL (ref 0.0–0.7)
Eosinophils Relative: 3.2 % (ref 0.0–5.0)
HEMATOCRIT: 49.5 % (ref 39.0–52.0)
Hemoglobin: 17.2 g/dL — ABNORMAL HIGH (ref 13.0–17.0)
LYMPHS PCT: 38.2 % (ref 12.0–46.0)
Lymphs Abs: 4 10*3/uL (ref 0.7–4.0)
MCHC: 34.8 g/dL (ref 30.0–36.0)
MCV: 87.7 fl (ref 78.0–100.0)
Monocytes Absolute: 0.9 10*3/uL (ref 0.1–1.0)
Monocytes Relative: 8.5 % (ref 3.0–12.0)
NEUTROS ABS: 5.2 10*3/uL (ref 1.4–7.7)
Neutrophils Relative %: 49.1 % (ref 43.0–77.0)
PLATELETS: 255 10*3/uL (ref 150.0–400.0)
RBC: 5.65 Mil/uL (ref 4.22–5.81)
RDW: 14.3 % (ref 11.5–15.5)
WBC: 10.6 10*3/uL — AB (ref 4.0–10.5)

## 2017-04-13 ENCOUNTER — Other Ambulatory Visit: Payer: Self-pay | Admitting: Family Medicine

## 2017-05-02 ENCOUNTER — Encounter: Payer: Self-pay | Admitting: Family Medicine

## 2017-05-21 ENCOUNTER — Ambulatory Visit (INDEPENDENT_AMBULATORY_CARE_PROVIDER_SITE_OTHER): Payer: BC Managed Care – PPO | Admitting: Orthopaedic Surgery

## 2017-05-21 ENCOUNTER — Ambulatory Visit (INDEPENDENT_AMBULATORY_CARE_PROVIDER_SITE_OTHER): Payer: BC Managed Care – PPO

## 2017-05-21 DIAGNOSIS — M25552 Pain in left hip: Secondary | ICD-10-CM

## 2017-05-21 DIAGNOSIS — M1612 Unilateral primary osteoarthritis, left hip: Secondary | ICD-10-CM | POA: Insufficient documentation
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
## 2017-06-01 ENCOUNTER — Other Ambulatory Visit: Payer: Self-pay | Admitting: Family Medicine

## 2017-06-27 NOTE — Progress Notes (Signed)
Please place orders in Epic as patient is being scheduled for a pre-op appointment! Thank you! 

## 2017-07-02 ENCOUNTER — Other Ambulatory Visit (INDEPENDENT_AMBULATORY_CARE_PROVIDER_SITE_OTHER): Payer: Self-pay | Admitting: Physician Assistant

## 2017-07-08 ENCOUNTER — Other Ambulatory Visit (HOSPITAL_COMMUNITY): Payer: Self-pay | Admitting: Emergency Medicine

## 2017-07-08 NOTE — Patient Instructions (Signed)
Matthew Reese  07/08/2017   Your procedure is scheduled on: 07-12-17  Report to [facility]  Entrance    Report to admitting at 1015AM   Call this number if you have problems the morning of surgery  [phone]   Remember: ONLY 1 PERSON MAY GO WITH YOU TO SHORT STAY TO GET  READY MORNING OF YOUR SURGERY.  Do not eat food or drink liquids :After Midnight.     Take these medicines the morning of surgery with A SIP OF WATER: SYNTHROID, OMEPRAZOLE, HYDROCODONE IF NEEDED                                You may not have any metal on your body including hair pins and              piercings  Do not wear jewelry, make-up, lotions, powders or perfumes, deodorant                     Men may shave face and neck.   Do not bring valuables to the hospital. [place].  Contacts, dentures or bridgework may not be worn into surgery.  Leave suitcase in the car. After surgery it may be brought to your room.                 Please read over the following fact sheets you were given: _____________________________________________________________________            [facility] - Preparing for Surgery Before surgery, you can play an important role.  Because skin is not sterile, your skin needs to be as free of germs as possible.  You can reduce the number of germs on your skin by washing with CHG (chlorahexidine gluconate) soap before surgery.  CHG is an antiseptic cleaner which kills germs and bonds with the skin to continue killing germs even after washing. Please DO NOT use if you have an allergy to CHG or antibacterial soaps.  If your skin becomes reddened/irritated stop using the CHG and inform your nurse when you arrive at Short Stay. Do not shave (including legs and underarms) for at least 48 hours prior to the first CHG shower.  You may shave your face/neck. Please follow these instructions carefully:  1.  Shower  with CHG Soap the night before surgery and the  morning of Surgery.  2.  If you choose to wash your hair, wash your hair first as usual with your  normal  shampoo.  3.  After you shampoo, rinse your hair and body thoroughly to remove the  shampoo.                           4.  Use CHG as you would any other liquid soap.  You can apply chg directly  to the skin and wash                       Gently with a scrungie or clean washcloth.  5.  Apply the CHG Soap to your body ONLY FROM THE NECK DOWN.   Do not use on face/ open  Wound or open sores. Avoid contact with eyes, ears mouth and genitals (private parts).                       Wash face,  Genitals (private parts) with your normal soap.             6.  Wash thoroughly, paying special attention to the area where your surgery  will be performed.  7.  Thoroughly rinse your body with warm water from the neck down.  8.  DO NOT shower/wash with your normal soap after using and rinsing off  the CHG Soap.                9.  Pat yourself dry with a clean towel.            10.  Wear clean pajamas.            11.  Place clean sheets on your bed the night of your first shower and do not  sleep with pets. Day of Surgery : Do not apply any lotions/deodorants the morning of surgery.  Please wear clean clothes to the hospital/surgery center.  FAILURE TO FOLLOW THESE INSTRUCTIONS MAY RESULT IN THE CANCELLATION OF YOUR SURGERY PATIENT SIGNATURE_________________________________  NURSE SIGNATURE__________________________________  ________________________________________________________________________   Matthew Reese  An incentive spirometer is a tool that can help keep your lungs clear and active. This tool measures how well you are filling your lungs with each breath. Taking long deep breaths may help reverse or decrease the chance of developing breathing (pulmonary) problems (especially infection) following:  A long period  of time when you are unable to move or be active. BEFORE THE PROCEDURE   If the spirometer includes an indicator to show your best effort, your nurse or respiratory therapist will set it to a desired goal.  If possible, sit up straight or lean slightly forward. Try not to slouch.  Hold the incentive spirometer in an upright position. INSTRUCTIONS FOR USE  1. Sit on the edge of your bed if possible, or sit up as far as you can in bed or on a chair. 2. Hold the incentive spirometer in an upright position. 3. Breathe out normally. 4. Place the mouthpiece in your mouth and seal your lips tightly around it. 5. Breathe in slowly and as deeply as possible, raising the piston or the ball toward the top of the column. 6. Hold your breath for 3-5 seconds or for as long as possible. Allow the piston or ball to fall to the bottom of the column. 7. Remove the mouthpiece from your mouth and breathe out normally. 8. Rest for a few seconds and repeat Steps 1 through 7 at least 10 times every 1-2 hours when you are awake. Take your time and take a few normal breaths between deep breaths. 9. The spirometer may include an indicator to show your best effort. Use the indicator as a goal to work toward during each repetition. 10. After each set of 10 deep breaths, practice coughing to be sure your lungs are clear. If you have an incision (the cut made at the time of surgery), support your incision when coughing by placing a pillow or rolled up towels firmly against it. Once you are able to get out of bed, walk around indoors and cough well. You may stop using the incentive spirometer when instructed by your caregiver.  RISKS AND COMPLICATIONS  Take your time so you do not get  dizzy or light-headed.  If you are in pain, you may need to take or ask for pain medication before doing incentive spirometry. It is harder to take a deep breath if you are having pain. AFTER USE  Rest and breathe slowly and easily.  It  can be helpful to keep track of a log of your progress. Your caregiver can provide you with a simple table to help with this. If you are using the spirometer at home, follow these instructions: [place] IF:   You are having difficultly using the spirometer.  You have trouble using the spirometer as often as instructed.  Your pain medication is not giving enough relief while using the spirometer.  You develop fever of 100.5 F (38.1 C) or higher. SEEK IMMEDIATE MEDICAL CARE IF:   You cough up bloody sputum that had not been present before.  You develop fever of 102 F (38.9 C) or greater.  You develop worsening pain at or near the incision site. MAKE SURE YOU:   Understand these instructions.  Will watch your condition.  Will get help right away if you are not doing well or get worse. Document Released: 12/10/2006 Document Revised: 10/22/2011 Document Reviewed: 02/10/2007 [facility] Patient Information 2014 [place], [place].   ________________________________________________________________________

## 2017-07-09 ENCOUNTER — Other Ambulatory Visit: Payer: Self-pay

## 2017-07-09 ENCOUNTER — Encounter (HOSPITAL_COMMUNITY)
Admission: RE | Admit: 2017-07-09 | Discharge: 2017-07-09 | Disposition: A | Discharge status: Home / Self Care | Payer: BC Managed Care – PPO | Source: Ambulatory Visit | Attending: Orthopaedic Surgery | Admitting: Orthopaedic Surgery

## 2017-07-09 ENCOUNTER — Encounter (HOSPITAL_COMMUNITY): Payer: Self-pay

## 2017-07-09 DIAGNOSIS — Z01812 Encounter for preprocedural laboratory examination: Secondary | ICD-10-CM

## 2017-07-09 LAB — COMPREHENSIVE METABOLIC PANEL
ALT: 77 U/L — ABNORMAL HIGH (ref 17–63)
ANION GAP: 7 (ref 5–15)
AST: 65 U/L — ABNORMAL HIGH (ref 15–41)
Albumin: 4.1 g/dL (ref 3.5–5.0)
Alkaline Phosphatase: 64 U/L (ref 38–126)
BUN: 16 mg/dL (ref 6–20)
CHLORIDE: 104 mmol/L (ref 101–111)
CO2: 25 mmol/L (ref 22–32)
Calcium: 9.2 mg/dL (ref 8.9–10.3)
Creatinine, Ser: 1.42 mg/dL — ABNORMAL HIGH (ref 0.61–1.24)
GFR calc non Af Amer: 57 mL/min — ABNORMAL LOW (ref >60)
GLUCOSE: 113 mg/dL — AB (ref 65–99)
POTASSIUM: 4.6 mmol/L (ref 3.5–5.1)
SODIUM: 136 mmol/L (ref 135–145)
Total Bilirubin: 0.5 mg/dL (ref 0.3–1.2)
Total Protein: 7.8 g/dL (ref 6.5–8.1)

## 2017-07-09 LAB — CBC
HCT: 43.8 % (ref 39.0–52.0)
HEMOGLOBIN: 14.8 g/dL (ref 13.0–17.0)
MCH: 29.8 pg (ref 26.0–34.0)
MCHC: 33.8 g/dL (ref 30.0–36.0)
MCV: 88.3 fL (ref 78.0–100.0)
PLATELETS: UNDETERMINED 10*3/uL (ref 150–400)
RBC: 4.96 MIL/uL (ref 4.22–5.81)
RDW: 13.6 % (ref 11.5–15.5)
WBC: 7.9 10*3/uL (ref 4.0–10.5)

## 2017-07-09 LAB — SURGICAL PCR SCREEN
MRSA, PCR: NEGATIVE
Staphylococcus aureus: NEGATIVE

## 2017-07-09 LAB — ABO/RH: ABO/RH(D): O NEG

## 2017-07-10 ENCOUNTER — Other Ambulatory Visit (INDEPENDENT_AMBULATORY_CARE_PROVIDER_SITE_OTHER): Payer: Self-pay

## 2017-07-11 MED ORDER — DEXTROSE 5 % IV SOLN
3.0000 g | INTRAVENOUS | Status: AC
  Administered 2017-07-12: 3 g via INTRAVENOUS
  Filled 2017-07-11: qty 3

## 2017-07-12 ENCOUNTER — Other Ambulatory Visit: Payer: Self-pay

## 2017-07-12 ENCOUNTER — Inpatient Hospital Stay (HOSPITAL_COMMUNITY): Payer: BC Managed Care – PPO | Admitting: Certified Registered Nurse Anesthetist

## 2017-07-12 ENCOUNTER — Inpatient Hospital Stay (HOSPITAL_COMMUNITY): Payer: BC Managed Care – PPO

## 2017-07-12 ENCOUNTER — Inpatient Hospital Stay (HOSPITAL_COMMUNITY)
Admission: RE | Admit: 2017-07-12 | Discharge: 2017-07-14 | DRG: 470 | Disposition: A | Discharge status: Home Health | Payer: BC Managed Care – PPO | Source: Ambulatory Visit | Attending: Orthopaedic Surgery | Admitting: Orthopaedic Surgery

## 2017-07-12 ENCOUNTER — Encounter (HOSPITAL_COMMUNITY)
Admission: RE | Disposition: A | Discharge status: Home Health | Payer: Self-pay | Source: Ambulatory Visit | Attending: Orthopaedic Surgery

## 2017-07-12 ENCOUNTER — Encounter (HOSPITAL_COMMUNITY): Payer: Self-pay | Admitting: *Deleted

## 2017-07-12 DIAGNOSIS — N289 Disorder of kidney and ureter, unspecified: Secondary | ICD-10-CM | POA: Diagnosis present

## 2017-07-12 DIAGNOSIS — Z96642 Presence of left artificial hip joint: Secondary | ICD-10-CM

## 2017-07-12 DIAGNOSIS — M1612 Unilateral primary osteoarthritis, left hip: Secondary | ICD-10-CM | POA: Diagnosis present

## 2017-07-12 DIAGNOSIS — Z6836 Body mass index (BMI) 36.0-36.9, adult: Secondary | ICD-10-CM | POA: Diagnosis not present

## 2017-07-12 DIAGNOSIS — K760 Fatty (change of) liver, not elsewhere classified: Secondary | ICD-10-CM | POA: Diagnosis present

## 2017-07-12 DIAGNOSIS — Z87891 Personal history of nicotine dependence: Secondary | ICD-10-CM | POA: Diagnosis not present

## 2017-07-12 DIAGNOSIS — Z79899 Other long term (current) drug therapy: Secondary | ICD-10-CM

## 2017-07-12 DIAGNOSIS — E039 Hypothyroidism, unspecified: Secondary | ICD-10-CM | POA: Diagnosis present

## 2017-07-12 DIAGNOSIS — E669 Obesity, unspecified: Secondary | ICD-10-CM | POA: Diagnosis present

## 2017-07-12 DIAGNOSIS — Z419 Encounter for procedure for purposes other than remedying health state, unspecified: Secondary | ICD-10-CM

## 2017-07-12 HISTORY — PX: TOTAL HIP ARTHROPLASTY: SHX124

## 2017-07-12 LAB — TYPE AND SCREEN
ABO/RH(D): O NEG
ANTIBODY SCREEN: NEGATIVE

## 2017-07-12 SURGERY — ARTHROPLASTY, HIP, TOTAL, ANTERIOR APPROACH
Anesthesia: Spinal | Site: Hip | Laterality: Left

## 2017-07-12 MED ORDER — ASPIRIN EC 325 MG PO TBEC
325.0000 mg | DELAYED_RELEASE_TABLET | Freq: Every day | ORAL | Status: DC
  Administered 2017-07-13 – 2017-07-14 (×2): 325 mg via ORAL
  Filled 2017-07-12 (×2): qty 1

## 2017-07-12 MED ORDER — ALUM & MAG HYDROXIDE-SIMETH 200-200-20 MG/5ML PO SUSP: 30.0000 mL | ORAL | Status: DC | PRN

## 2017-07-12 MED ORDER — METHOCARBAMOL 500 MG PO TABS
500.0000 mg | ORAL_TABLET | Freq: Four times a day (QID) | ORAL | Status: DC | PRN
  Administered 2017-07-12 – 2017-07-14 (×3): 500 mg via ORAL
  Filled 2017-07-12 (×3): qty 1

## 2017-07-12 MED ORDER — HYDROMORPHONE HCL 1 MG/ML IJ SOLN: 1.0000 mg | INTRAMUSCULAR | Status: DC | PRN

## 2017-07-12 MED ORDER — PROPOFOL 10 MG/ML IV BOLUS
INTRAVENOUS | Status: AC
  Filled 2017-07-12: qty 20

## 2017-07-12 MED ORDER — CHLORHEXIDINE GLUCONATE 4 % EX LIQD: 60.0000 mL | Freq: Once | CUTANEOUS | Status: DC

## 2017-07-12 MED ORDER — SODIUM CHLORIDE 0.9 % IR SOLN
Status: DC | PRN
  Administered 2017-07-12: 1000 mL

## 2017-07-12 MED ORDER — ONDANSETRON HCL 4 MG/2ML IJ SOLN: 4.0000 mg | Freq: Four times a day (QID) | INTRAMUSCULAR | Status: DC | PRN

## 2017-07-12 MED ORDER — METHOCARBAMOL 1000 MG/10ML IJ SOLN
500.0000 mg | Freq: Four times a day (QID) | INTRAVENOUS | Status: DC | PRN
  Filled 2017-07-12: qty 5

## 2017-07-12 MED ORDER — DEXAMETHASONE SODIUM PHOSPHATE 10 MG/ML IJ SOLN
INTRAMUSCULAR | Status: AC
  Filled 2017-07-12: qty 1

## 2017-07-12 MED ORDER — DOCUSATE SODIUM 100 MG PO CAPS
100.0000 mg | ORAL_CAPSULE | Freq: Two times a day (BID) | ORAL | Status: DC
  Administered 2017-07-12 – 2017-07-14 (×4): 100 mg via ORAL
  Filled 2017-07-12 (×4): qty 1

## 2017-07-12 MED ORDER — FENTANYL CITRATE (PF) 100 MCG/2ML IJ SOLN
INTRAMUSCULAR | Status: AC
  Filled 2017-07-12: qty 2

## 2017-07-12 MED ORDER — STERILE WATER FOR IRRIGATION IR SOLN
Status: DC | PRN
  Administered 2017-07-12: 2000 mL

## 2017-07-12 MED ORDER — FENTANYL CITRATE (PF) 100 MCG/2ML IJ SOLN
25.0000 ug | INTRAMUSCULAR | Status: DC | PRN
  Administered 2017-07-12: 50 ug via INTRAVENOUS
  Administered 2017-07-12 (×4): 25 ug via INTRAVENOUS

## 2017-07-12 MED ORDER — PROPOFOL 10 MG/ML IV BOLUS
INTRAVENOUS | Status: DC | PRN
  Administered 2017-07-12 (×3): 10 mg via INTRAVENOUS
  Administered 2017-07-12: 20 mg via INTRAVENOUS
  Administered 2017-07-12 (×2): 10 mg via INTRAVENOUS
  Administered 2017-07-12: 30 mg via INTRAVENOUS
  Administered 2017-07-12: 20 mg via INTRAVENOUS
  Administered 2017-07-12: 10 mg via INTRAVENOUS

## 2017-07-12 MED ORDER — DIPHENHYDRAMINE HCL 12.5 MG/5ML PO ELIX: 12.5000 mg | ORAL_SOLUTION | ORAL | Status: DC | PRN

## 2017-07-12 MED ORDER — ACETAMINOPHEN 650 MG RE SUPP: 650.0000 mg | RECTAL | Status: DC | PRN

## 2017-07-12 MED ORDER — ONDANSETRON HCL 4 MG/2ML IJ SOLN
INTRAMUSCULAR | Status: AC
  Filled 2017-07-12: qty 2

## 2017-07-12 MED ORDER — DEXAMETHASONE SODIUM PHOSPHATE 10 MG/ML IJ SOLN
INTRAMUSCULAR | Status: DC | PRN
  Administered 2017-07-12: 10 mg via INTRAVENOUS

## 2017-07-12 MED ORDER — TRANEXAMIC ACID 1000 MG/10ML IV SOLN
1000.0000 mg | INTRAVENOUS | Status: AC
  Administered 2017-07-12: 1000 mg via INTRAVENOUS
  Filled 2017-07-12: qty 1100

## 2017-07-12 MED ORDER — LACTATED RINGERS IV SOLN
INTRAVENOUS | Status: DC
  Administered 2017-07-12 (×3): via INTRAVENOUS

## 2017-07-12 MED ORDER — 0.9 % SODIUM CHLORIDE (POUR BTL) OPTIME
TOPICAL | Status: DC | PRN
  Administered 2017-07-12: 1000 mL

## 2017-07-12 MED ORDER — ZOLPIDEM TARTRATE 5 MG PO TABS: 5.0000 mg | ORAL_TABLET | Freq: Every evening | ORAL | Status: DC | PRN

## 2017-07-12 MED ORDER — METOCLOPRAMIDE HCL 5 MG/ML IJ SOLN: 5.0000 mg | Freq: Three times a day (TID) | INTRAMUSCULAR | Status: DC | PRN

## 2017-07-12 MED ORDER — ACETAMINOPHEN 325 MG PO TABS: 650.0000 mg | ORAL_TABLET | ORAL | Status: DC | PRN

## 2017-07-12 MED ORDER — POLYETHYLENE GLYCOL 3350 17 G PO PACK: 17.0000 g | PACK | Freq: Every day | ORAL | Status: DC | PRN

## 2017-07-12 MED ORDER — LEVOTHYROXINE SODIUM 100 MCG PO TABS
200.0000 ug | ORAL_TABLET | Freq: Every day | ORAL | Status: DC
  Administered 2017-07-13 – 2017-07-14 (×2): 200 ug via ORAL
  Filled 2017-07-12 (×2): qty 2

## 2017-07-12 MED ORDER — MIDAZOLAM HCL 5 MG/5ML IJ SOLN
INTRAMUSCULAR | Status: DC | PRN
  Administered 2017-07-12: 2 mg via INTRAVENOUS

## 2017-07-12 MED ORDER — PHENOL 1.4 % MT LIQD: 1.0000 | OROMUCOSAL | Status: DC | PRN

## 2017-07-12 MED ORDER — CEFAZOLIN SODIUM-DEXTROSE 2-4 GM/100ML-% IV SOLN
2.0000 g | Freq: Four times a day (QID) | INTRAVENOUS | Status: AC
  Administered 2017-07-12 – 2017-07-13 (×2): 2 g via INTRAVENOUS
  Filled 2017-07-12 (×2): qty 100

## 2017-07-12 MED ORDER — PROMETHAZINE HCL 25 MG/ML IJ SOLN: 6.2500 mg | INTRAMUSCULAR | Status: DC | PRN

## 2017-07-12 MED ORDER — HYDROCODONE-ACETAMINOPHEN 7.5-325 MG PO TABS
1.0000 | ORAL_TABLET | ORAL | Status: DC | PRN
  Administered 2017-07-12 (×2): 1 via ORAL
  Administered 2017-07-13 – 2017-07-14 (×2): 2 via ORAL
  Filled 2017-07-12 (×2): qty 1
  Filled 2017-07-12 (×2): qty 2

## 2017-07-12 MED ORDER — METOCLOPRAMIDE HCL 5 MG PO TABS: 5.0000 mg | ORAL_TABLET | Freq: Three times a day (TID) | ORAL | Status: DC | PRN

## 2017-07-12 MED ORDER — HYDROMORPHONE HCL 1 MG/ML IJ SOLN
0.2500 mg | INTRAMUSCULAR | Status: DC | PRN
  Administered 2017-07-12: 0.5 mg via INTRAVENOUS
  Administered 2017-07-12 (×3): 0.25 mg via INTRAVENOUS
  Administered 2017-07-12: 0.5 mg via INTRAVENOUS
  Administered 2017-07-12: 0.25 mg via INTRAVENOUS

## 2017-07-12 MED ORDER — SODIUM CHLORIDE 0.9 % IV SOLN
INTRAVENOUS | Status: DC
  Administered 2017-07-12: 18:00:00 via INTRAVENOUS

## 2017-07-12 MED ORDER — HYDROMORPHONE HCL 1 MG/ML IJ SOLN
INTRAMUSCULAR | Status: AC
Start: 2017-07-12 — End: 2017-07-13
  Filled 2017-07-12: qty 2

## 2017-07-12 MED ORDER — FENTANYL CITRATE (PF) 100 MCG/2ML IJ SOLN
INTRAMUSCULAR | Status: DC | PRN
  Administered 2017-07-12 (×2): 50 ug via INTRAVENOUS

## 2017-07-12 MED ORDER — ONDANSETRON HCL 4 MG PO TABS: 4.0000 mg | ORAL_TABLET | Freq: Four times a day (QID) | ORAL | Status: DC | PRN

## 2017-07-12 MED ORDER — PROPOFOL 10 MG/ML IV BOLUS
INTRAVENOUS | Status: AC
  Filled 2017-07-12: qty 60

## 2017-07-12 MED ORDER — PROPOFOL 500 MG/50ML IV EMUL
INTRAVENOUS | Status: DC | PRN
  Administered 2017-07-12: 50 ug/kg/min via INTRAVENOUS

## 2017-07-12 MED ORDER — PANTOPRAZOLE SODIUM 40 MG PO TBEC
80.0000 mg | DELAYED_RELEASE_TABLET | Freq: Every day | ORAL | Status: DC
  Administered 2017-07-13 – 2017-07-14 (×2): 80 mg via ORAL
  Filled 2017-07-12 (×2): qty 2

## 2017-07-12 MED ORDER — BUPIVACAINE IN DEXTROSE 0.75-8.25 % IT SOLN
INTRATHECAL | Status: DC | PRN
  Administered 2017-07-12: 2 mL via INTRATHECAL

## 2017-07-12 MED ORDER — OXYCODONE HCL 5 MG PO TABS
10.0000 mg | ORAL_TABLET | ORAL | Status: DC | PRN
  Administered 2017-07-12 – 2017-07-13 (×5): 10 mg via ORAL
  Filled 2017-07-12 (×5): qty 2

## 2017-07-12 MED ORDER — ONDANSETRON HCL 4 MG/2ML IJ SOLN
INTRAMUSCULAR | Status: DC | PRN
  Administered 2017-07-12: 4 mg via INTRAVENOUS

## 2017-07-12 MED ORDER — MIDAZOLAM HCL 2 MG/2ML IJ SOLN
INTRAMUSCULAR | Status: AC
  Filled 2017-07-12: qty 2

## 2017-07-12 MED ORDER — MENTHOL 3 MG MT LOZG: 1.0000 | LOZENGE | OROMUCOSAL | Status: DC | PRN

## 2017-07-12 SURGICAL SUPPLY — 38 items
APL SKNCLS STERI-STRIP NONHPOA (GAUZE/BANDAGES/DRESSINGS)
BAG SPEC THK2 15X12 ZIP CLS (MISCELLANEOUS)
BAG ZIPLOCK 12X15 (MISCELLANEOUS) IMPLANT
BENZOIN TINCTURE PRP APPL 2/3 (GAUZE/BANDAGES/DRESSINGS) IMPLANT
BLADE SAW SGTL 18X1.27X75 (BLADE) ×2 IMPLANT
BLADE SAW SGTL 18X1.27X75MM (BLADE) ×1
CAPT HIP TOTAL 2 ×2 IMPLANT
CELLS DAT CNTRL 66122 CELL SVR (MISCELLANEOUS) IMPLANT
CLOSURE WOUND 1/2 X4 (GAUZE/BANDAGES/DRESSINGS)
COVER PERINEAL POST (MISCELLANEOUS) ×3 IMPLANT
COVER SURGICAL LIGHT HANDLE (MISCELLANEOUS) ×3 IMPLANT
DRAPE STERI IOBAN 125X83 (DRAPES) ×3 IMPLANT
DRAPE U-SHAPE 47X51 STRL (DRAPES) ×6 IMPLANT
DRSG AQUACEL AG ADV 3.5X10 (GAUZE/BANDAGES/DRESSINGS) ×3 IMPLANT
DURAPREP 26ML APPLICATOR (WOUND CARE) ×3 IMPLANT
ELECT REM PT RETURN 15FT ADLT (MISCELLANEOUS) ×3 IMPLANT
GAUZE XEROFORM 1X8 LF (GAUZE/BANDAGES/DRESSINGS) ×2 IMPLANT
GLOVE BIO SURGEON STRL SZ7.5 (GLOVE) ×3 IMPLANT
GLOVE BIOGEL PI IND STRL 8 (GLOVE) ×2 IMPLANT
GLOVE BIOGEL PI INDICATOR 8 (GLOVE) ×4
GLOVE ECLIPSE 8.0 STRL XLNG CF (GLOVE) ×3 IMPLANT
GOWN STRL REUS W/TWL XL LVL3 (GOWN DISPOSABLE) ×6 IMPLANT
HANDPIECE INTERPULSE COAX TIP (DISPOSABLE) ×3
HOLDER FOLEY CATH W/STRAP (MISCELLANEOUS) ×3 IMPLANT
PACK ANTERIOR HIP CUSTOM (KITS) ×3 IMPLANT
RETRACTOR WND ALEXIS 18 MED (MISCELLANEOUS) IMPLANT
RTRCTR WOUND ALEXIS 18CM MED (MISCELLANEOUS)
SET HNDPC FAN SPRY TIP SCT (DISPOSABLE) ×1 IMPLANT
STAPLER VISISTAT 35W (STAPLE) IMPLANT
STRIP CLOSURE SKIN 1/2X4 (GAUZE/BANDAGES/DRESSINGS) IMPLANT
SUT ETHIBOND NAB CT1 #1 30IN (SUTURE) ×3 IMPLANT
SUT MNCRL AB 4-0 PS2 18 (SUTURE) IMPLANT
SUT VIC AB 0 CT1 36 (SUTURE) ×3 IMPLANT
SUT VIC AB 1 CT1 36 (SUTURE) ×3 IMPLANT
SUT VIC AB 2-0 CT1 27 (SUTURE) ×6
SUT VIC AB 2-0 CT1 TAPERPNT 27 (SUTURE) ×2 IMPLANT
TRAY FOLEY W/METER SILVER 16FR (SET/KITS/TRAYS/PACK) ×3 IMPLANT
YANKAUER SUCT BULB TIP 10FT TU (MISCELLANEOUS) ×3 IMPLANT

## 2017-07-12 NOTE — Anesthesia Postprocedure Evaluation (Signed)
Anesthesia Post Note  Patient: Matthew Reese  Procedure(s) Performed: LEFT TOTAL HIP ARTHROPLASTY ANTERIOR APPROACH (Left Hip)     Patient location during evaluation: PACU Anesthesia Type: Spinal Level of consciousness: oriented and awake and alert Pain management: pain level controlled Vital Signs Assessment: post-procedure vital signs reviewed and stable Respiratory status: spontaneous breathing, respiratory function stable and patient connected to nasal cannula oxygen Cardiovascular status: blood pressure returned to baseline and stable Postop Assessment: no headache, no backache, no apparent nausea or vomiting, patient able to bend at knees and spinal receding Anesthetic complications: no    Last Vitals:  Vitals:   07/12/17 1624 07/12/17 1730  BP: 130/79 (!) 147/80  Pulse: 68 69  Resp: 14 16  Temp: 36.7 C 36.7 C  SpO2: 96% 98%    Last Pain:  Vitals:   07/12/17 1743  TempSrc:   PainSc: [place]

## 2017-07-12 NOTE — Anesthesia Procedure Notes (Signed)
Spinal  Patient location during procedure: OR Start time: 07/12/2017 11:58 AM End time: 07/12/2017 12:05 PM Staffing Anesthesiologist: Catalina Gravel, MD Resident/CRNA: Montel Clock, CRNA Performed: resident/CRNA  Preanesthetic Checklist Completed: patient identified, surgical consent, pre-op evaluation, timeout performed, IV checked, risks and benefits discussed and monitors and equipment checked Spinal Block Patient position: sitting Prep: DuraPrep Patient monitoring: heart rate, continuous pulse ox and blood pressure Approach: midline Location: L3-4 Needle Needle type: [place]  Needle gauge: 24 G Needle length: 10 cm Needle insertion depth: 8.5 cm Assessment Sensory level: T6
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
## 2017-07-12 NOTE — Anesthesia Preprocedure Evaluation (Addendum)
Anesthesia Evaluation  Patient identified by MRN, date of birth, ID band Patient awake    Reviewed: Allergy & Precautions, NPO status , Patient's Chart, lab work & pertinent test results  History of Anesthesia Complications Negative for: history of anesthetic complications  Airway Mallampati: II  TM Distance: >3 FB Neck ROM: Full    Dental  (+) Teeth Intact, Dental Advisory Given   Pulmonary former smoker,    Pulmonary exam normal breath sounds clear to auscultation       Cardiovascular Exercise Tolerance: Good negative cardio ROS Normal cardiovascular exam Rhythm:Regular Rate:Normal     Neuro/Psych  Headaches, negative psych ROS   GI/Hepatic Neg liver ROS, GERD  Medicated and Controlled,  Endo/Other  Hypothyroidism Obesity   Renal/GU Renal InsufficiencyRenal disease     Musculoskeletal  (+) Arthritis , Osteoarthritis,    Abdominal   Peds  Hematology   Anesthesia Other Findings Day of surgery medications reviewed with the patient.  Reproductive/Obstetrics                            Anesthesia Physical Anesthesia Plan  ASA: II  Anesthesia Plan: Spinal and MAC   Post-op Pain Management:    Induction: Intravenous  PONV Risk Score and Plan: 1 and Propofol infusion  Airway Management Planned:   Additional Equipment:   Intra-op Plan:   Post-operative Plan:   Informed Consent: I have reviewed the patients History and Physical, chart, labs and discussed the procedure including the risks, benefits and alternatives for the proposed anesthesia with the patient or authorized representative who has indicated his/her understanding and acceptance.   Dental advisory given  Plan Discussed with: CRNA, Anesthesiologist and Surgeon  Anesthesia Plan Comments: (Discussed risks and benefits of and differences between spinal and general. Discussed risks of spinal including headache, backache,  failure, bleeding, infection, and nerve damage. Patient consents to spinal. Questions answered. Coagulation studies and platelet count acceptable.)        Anesthesia Quick Evaluation

## 2017-07-12 NOTE — Brief Op Note (Signed)
07/12/2017  1:55 PM  PATIENT:  Matthew Reese  46 y.o. male  PRE-OPERATIVE DIAGNOSIS:  severe post traumatic arthritis left hip  POST-OPERATIVE DIAGNOSIS:  severe post traumatic arthritis left hip  PROCEDURE:  Procedure(s): LEFT TOTAL HIP ARTHROPLASTY ANTERIOR APPROACH (Left)  SURGEON:  Surgeon(s) and Role:    Mcarthur Rossetti, MD - Primary  PHYSICIAN ASSISTANT: Benita Stabile, PA-C  ANESTHESIA:   spinal  EBL:  650 mL   COUNTS:  YES  DICTATION: .Other Dictation: Dictation Number [id number]  PLAN OF CARE: Admit to inpatient   PATIENT DISPOSITION:  PACU - hemodynamically stable.   Delay start of Pharmacological VTE agent (>24hrs) due to surgical blood loss or risk of bleeding: no

## 2017-07-12 NOTE — Transfer of Care (Signed)
Immediate Anesthesia Transfer of Care Note  Patient: MCKENNON ZWART  Procedure(s) Performed: LEFT TOTAL HIP ARTHROPLASTY ANTERIOR APPROACH (Left Hip)  Patient Location: PACU  Anesthesia Type:Spinal  Level of Consciousness: drowsy and patient cooperative  Airway & Oxygen Therapy: Patient Spontanous Breathing and Patient connected to face mask  Post-op Assessment: Report given to RN and Post -op Vital signs reviewed and stable  Post vital signs: Reviewed and stable  Last Vitals:  Vitals:   07/12/17 1004  BP: (!) 170/99  Pulse: 77  Resp: 18  Temp: 36.8 C    Last Pain:  Vitals:   07/12/17 1004  TempSrc: Oral      Patients Stated Pain Goal: 3 (61/53/79 4327)  Complications: No apparent anesthesia complications

## 2017-07-13 ENCOUNTER — Other Ambulatory Visit: Payer: Self-pay

## 2017-07-13 LAB — BASIC METABOLIC PANEL WITH GFR
Anion gap: 8 (ref 5–15)
BUN: 16 mg/dL (ref 6–20)
CO2: 26 mmol/L (ref 22–32)
Calcium: 8.8 mg/dL — ABNORMAL LOW (ref 8.9–10.3)
Chloride: 100 mmol/L — ABNORMAL LOW (ref 101–111)
Creatinine, Ser: 1.46 mg/dL — ABNORMAL HIGH (ref 0.61–1.24)
GFR calc Af Amer: >60 mL/min
GFR calc non Af Amer: 55 mL/min — ABNORMAL LOW
Glucose, Bld: 127 mg/dL — ABNORMAL HIGH (ref 65–99)
Potassium: 4.4 mmol/L (ref 3.5–5.1)
Sodium: 134 mmol/L — ABNORMAL LOW (ref 135–145)

## 2017-07-13 LAB — CBC
HCT: 37.6 % — ABNORMAL LOW (ref 39.0–52.0)
Hemoglobin: 12.5 g/dL — ABNORMAL LOW (ref 13.0–17.0)
MCH: 29 pg (ref 26.0–34.0)
MCHC: 33.2 g/dL (ref 30.0–36.0)
MCV: 87.2 fL (ref 78.0–100.0)
Platelets: ADEQUATE 10*3/uL (ref 150–400)
RBC: 4.31 MIL/uL (ref 4.22–5.81)
RDW: 13.3 % (ref 11.5–15.5)
WBC: 18 10*3/uL — ABNORMAL HIGH (ref 4.0–10.5)

## 2017-07-13 MED ORDER — METHOCARBAMOL 500 MG PO TABS: 500.0000 mg | ORAL_TABLET | Freq: Four times a day (QID) | ORAL | 0 refills | Status: DC | PRN

## 2017-07-13 MED ORDER — ASPIRIN 325 MG PO TBEC: 325.0000 mg | DELAYED_RELEASE_TABLET | Freq: Every day | ORAL | 0 refills | Status: DC

## 2017-07-13 MED ORDER — OXYCODONE-ACETAMINOPHEN 5-325 MG PO TABS: 1.0000 | ORAL_TABLET | ORAL | 0 refills | Status: DC | PRN

## 2017-07-13 NOTE — Evaluation (Signed)
Physical Therapy Evaluation Patient Details Name: Matthew Reese MRN: [id number] DOB: 15-Apr-1971 Today's Date: 07/13/2017   History of Present Illness  s/p L THA;  Clinical Impression  Pt is s/p THA resulting in the deficits listed below (see PT Problem List).  Pt will benefit from skilled PT to increase their independence and safety with mobility to allow discharge to the venue listed below.   amb 100' with RW and supervision, should progress well; will continue to follow     Follow Up Recommendations DC plan and follow up therapy as arranged by surgeon;Home health PT    Equipment Recommendations  Rolling walker with 5" wheels(wide, tall)    Recommendations for Other Services       Precautions / Restrictions Precautions Precautions: Fall Restrictions Weight Bearing Restrictions: No Other Position/Activity Restrictions: WBAT      Mobility  Bed Mobility Overal bed mobility: Needs Assistance Bed Mobility: Supine to Sit     Supine to sit: Min assist     General bed mobility comments: assist with LLE  Transfers Overall transfer level: Needs assistance Equipment used: Rolling walker (2 wheeled) Transfers: Sit to/from Stand Sit to Stand: Min guard         General transfer comment: cues for hand placement and LLE management  Ambulation/Gait Ambulation/Gait assistance: Min guard;Supervision Ambulation Distance (Feet): 100 Feet Assistive device: Rolling walker (2 wheeled) Gait Pattern/deviations: Step-to pattern;Decreased weight shift to left     General Gait Details: cues for sequence  Stairs            Wheelchair Mobility    Modified Rankin (Stroke Patients Only)       Balance                                             Pertinent Vitals/Pain Pain Assessment: 0-10 Pain Score: 2  Pain Location: left hip and knee Pain Descriptors / Indicators: Sore Pain Intervention(s): Limited activity within patient's tolerance;Monitored  during session    Home Living Family/patient expects to be discharged to:: Private residence Living Arrangements: Spouse/significant other Available Help at Discharge: Family Type of Home: House Home Access: Ramped entrance     Home Layout: One level Home Equipment: None;Shower seat - built in      Prior Function Level of Independence: Independent               Journalist, newspaper        Extremity/Trunk Assessment   Upper Extremity Assessment Upper Extremity Assessment: Defer to OT evaluation    Lower Extremity Assessment Lower Extremity Assessment: LLE deficits/detail LLE Deficits / Details: knee extension grossly 3/5, hip flexion 2+/5, ankle WFL, limited by anticipated post op pain and weakness       Communication   Communication: No difficulties  Cognition Arousal/Alertness: Awake/alert Behavior During Therapy: WFL for tasks assessed/performed Overall Cognitive Status: Within Functional Limits for tasks assessed                                        General Comments      Exercises Total Joint Exercises Ankle Circles/Pumps: AROM;Both Quad Sets: Both;AROM   Assessment/Plan    PT Assessment Patient needs continued PT services  PT Problem List Decreased strength;Decreased range of motion;Decreased mobility;Decreased knowledge of use of DME;Pain  PT Treatment Interventions DME instruction;Gait training;Functional mobility training;Therapeutic activities;Therapeutic exercise;Patient/family education;Stair training    PT Goals (Current goals can be found in the Care Plan section)  Acute Rehab PT Goals Patient Stated Goal: home, less pain PT Goal Formulation: With patient Time For Goal Achievement: 07/19/17 Potential to Achieve Goals: Good    Frequency 7X/week   Barriers to discharge        Co-evaluation               AM-PAC PT "6 Clicks" Daily Activity  Outcome Measure Difficulty turning over in bed (including  adjusting bedclothes, sheets and blankets)?: Unable Difficulty moving from lying on back to sitting on the side of the bed? : Unable Difficulty sitting down on and standing up from a chair with arms (e.g., wheelchair, bedside commode, etc,.)?: A Little Help needed moving to and from a bed to chair (including a wheelchair)?: A Little Help needed walking in hospital room?: A Little Help needed climbing 3-5 steps with a railing? : A Little 6 Click Score: 14    End of Session   Activity Tolerance: Patient tolerated treatment well Patient left: in chair;with call bell/phone within reach;with family/visitor present   PT Visit Diagnosis: Difficulty in walking, not elsewhere classified (R26.2)    Time: 8675-4492 PT Time Calculation (min) (ACUTE ONLY): 33 min   Charges:   PT Evaluation $PT Eval Low Complexity: 1 Low PT Treatments $Gait Training: 8-22 mins   PT G Codes:          Lanaiya Lantry 08/09/17, 12:19 PM

## 2017-07-13 NOTE — Discharge Instructions (Signed)

## 2017-07-13 NOTE — Plan of Care (Signed)
Plan of care reviewed with patient.

## 2017-07-13 NOTE — Progress Notes (Signed)
Physical Therapy Treatment Patient Details Name: Matthew Reese MRN: [id number] DOB: [birth year] Today's Date: 07/13/2017    History of Present Illness s/p L THA;    PT Comments    Pt is progressing very well  Follow Up Recommendations  DC plan and follow up therapy as arranged by surgeon;Home health PT     Equipment Recommendations  Rolling walker with 5" wheels    Recommendations for Other Services       Precautions / Restrictions Precautions Precautions: Fall Restrictions Weight Bearing Restrictions: No Other Position/Activity Restrictions: WBAT    Mobility  Bed Mobility Overal bed mobility: Needs Assistance Bed Mobility: Sit to Supine       Sit to supine: Supervision   General bed mobility comments: cues for technique  Transfers Overall transfer level: Needs assistance Equipment used: Rolling walker (2 wheeled) Transfers: Sit to/from Stand Sit to Stand: Min guard         General transfer comment: cues for hand placement and LLE management  Ambulation/Gait Ambulation/Gait assistance: Min guard;Supervision Ambulation Distance (Feet): 160 Feet Assistive device: Rolling walker (2 wheeled) Gait Pattern/deviations: Step-to pattern;Decreased weight shift to left     General Gait Details: cues for sequence   Stairs            Wheelchair Mobility    Modified Rankin (Stroke Patients Only)       Balance                                            Cognition Arousal/Alertness: Awake/alert Behavior During Therapy: WFL for tasks assessed/performed Overall Cognitive Status: Within Functional Limits for tasks assessed                                        Exercises Total Joint Exercises Ankle Circles/Pumps: AROM;Both;10 reps Quad Sets: AROM;Both;10 reps Short Arc Quad: AROM;Left;10 reps Heel Slides: AAROM;Left;10 reps Hip ABduction/ADduction: AROM;Left;10 reps    General Comments        Pertinent  Vitals/Pain Pain Assessment: 0-10 Pain Score: 5  Pain Location: left hip Pain Descriptors / Indicators: Sore;Tightness Pain Intervention(s): Limited activity within patient's tolerance;Monitored during session;Premedicated before session    Home Living Family/patient expects to be discharged to:: Private residence Living Arrangements: Spouse/significant other Available Help at Discharge: Family;Available PRN/intermittently Type of Home: House Home Access: Ramped entrance   Home Layout: One level Home Equipment: Shower seat - built in;Grab bars - tub/shower;Grab bars - toilet;Bedside commode Additional Comments: ramp to back door    Prior Function Level of Independence: Independent          PT Goals (current goals can now be found in the care plan section) Acute Rehab PT Goals Patient Stated Goal: home, less pain PT Goal Formulation: With patient Time For Goal Achievement: 07/19/17 Potential to Achieve Goals: Good Progress towards PT goals: Progressing toward goals    Frequency    7X/week      PT Plan Current plan remains appropriate    Co-evaluation              AM-PAC PT "6 Clicks" Daily Activity  Outcome Measure  Difficulty turning over in bed (including adjusting bedclothes, sheets and blankets)?: Unable Difficulty moving from lying on back to sitting on the side of the bed? : Unable  Difficulty sitting down on and standing up from a chair with arms (e.g., wheelchair, bedside commode, etc,.)?: A Little Help needed moving to and from a bed to chair (including a wheelchair)?: A Little Help needed walking in hospital room?: A Little Help needed climbing 3-5 steps with a railing? : A Little 6 Click Score: 14    End of Session   Activity Tolerance: Patient tolerated treatment well Patient left: in bed;with call bell/phone within reach;with family/visitor present   PT Visit Diagnosis: Difficulty in walking, not elsewhere classified (R26.2)     Time:  4982-6415 PT Time Calculation (min) (ACUTE ONLY): 17 min  Charges:  $Therapeutic Exercise: 8-22 mins                    G Codes:          Jim Lundin 2017/07/25, 4:37 PM

## 2017-07-13 NOTE — Progress Notes (Signed)
Occupational Therapy Evaluation Patient Details Name: Matthew Reese MRN: [id number] DOB: April 27, 1971 Today's Date: 07/13/2017    History of Present Illness s/p L THA;   Clinical Impression   All OT education completed and patient questions answered. No further OT needs at this time. Will sign off.    Follow Up Recommendations  DC plan and follow up therapy as arranged by surgeon;Supervision - Intermittent    Equipment Recommendations  None recommended by OT(has all needed DME)    Recommendations for Other Services       Precautions / Restrictions Precautions Precautions: Fall Restrictions Weight Bearing Restrictions: No Other Position/Activity Restrictions: WBAT      Mobility Bed Mobility            General bed mobility comments: NT -- up in recliner  Transfers                 Balance                                           ADL either performed or assessed with clinical judgement   ADL Overall ADL's : Needs assistance/impaired Eating/Feeding: Independent   Grooming: Set up;Sitting   Upper Body Bathing: Set up;Sitting   Lower Body Bathing: Minimal assistance;Sit to/from stand   Upper Body Dressing : Set up;Sitting   Lower Body Dressing: Minimal assistance;Sit to/from stand   Toilet Transfer: Min guard;Ambulation;Comfort height toilet;BSC;Grab bars         Tub/Shower Transfer Details (indicate cue type and reason): demonstrated technique with RW and grab bar; pt verbalized understanding of technique Functional mobility during ADLs: Min guard;Rolling walker General ADL Comments: Patient educated on toilet transfer with grab bar/handicapped height and verbalized understanding. Patient almost able to reach feet but still with tightness L hip affecting his ability to do this. Educated on LB dressing techniques and AE, including where to purchase, if needed. Patient plans to wear slip on shoes and loose fitting pants at home.  Educated to don pants/underwear over operated leg first.     Museum/gallery curator      Pertinent Vitals/Pain Pain Assessment: 0-10 Pain Score: 5  Pain Location: left hip Pain Descriptors / Indicators: Sore;Tightness Pain Intervention(s): Limited activity within patient's tolerance;Monitored during session;Premedicated before session     Hand Dominance     Extremity/Trunk Assessment Upper Extremity Assessment Upper Extremity Assessment: Overall WFL for tasks assessed   Lower Extremity Assessment Lower Extremity Assessment: Defer to PT evaluation    Cervical / Trunk Assessment Cervical / Trunk Assessment: Normal   Communication Communication Communication: No difficulties   Cognition Arousal/Alertness: Awake/alert Behavior During Therapy: WFL for tasks assessed/performed Overall Cognitive Status: Within Functional Limits for tasks assessed                                     General Comments       Exercises    Shoulder Instructions      Home Living Family/patient expects to be discharged to:: Private residence Living Arrangements: Spouse/significant other Available Help at Discharge: Family;Available PRN/intermittently Type of Home: House Home Access: Ramped entrance     Home Layout: One level     Bathroom Shower/Tub: Occupational psychologist: Handicapped height  Bathroom Accessibility: Yes How Accessible: Accessible via walker Home Equipment: Shower seat - built in;Grab bars - tub/shower;Grab bars - toilet;Bedside commode   Additional Comments: ramp to back door      Prior Functioning/Environment Level of Independence: Independent                 OT Problem List: Decreased range of motion;Decreased activity tolerance;Decreased knowledge of use of DME or AE;Pain      OT Treatment/Interventions:      OT Goals(Current goals can be found in the care plan section) Acute Rehab OT Goals Patient Stated  Goal: home, less pain OT Goal Formulation: All assessment and education complete, DC therapy  OT Frequency:     Barriers to D/C:            Co-evaluation              AM-PAC PT "6 Clicks" Daily Activity     Outcome Measure Help from another person eating meals?: None Help from another person taking care of personal grooming?: None Help from another person toileting, which includes using toliet, bedpan, or urinal?: A Little Help from another person bathing (including washing, rinsing, drying)?: A Little Help from another person to put on and taking off regular upper body clothing?: None Help from another person to put on and taking off regular lower body clothing?: A Little 6 Click Score: 21   End of Session Equipment Utilized During Treatment: Rolling walker Nurse Communication: Mobility status  Activity Tolerance: Patient tolerated treatment well Patient left: in chair;with call bell/phone within reach;with family/visitor present  OT Visit Diagnosis: Muscle weakness (generalized) (M62.81);Pain;Unsteadiness on feet (R26.81) Pain - Right/Left: Left Pain - part of body: Hip                Time: 0865-7846 OT Time Calculation (min): 13 min Charges:  OT General Charges $OT Visit: 1 Visit OT Evaluation $OT Eval Low Complexity: 1 Low G-Codes:       Jeremih Dearmas A Channelle Bottger 2017/08/06, 2:21 PM

## 2017-07-13 NOTE — Progress Notes (Signed)
Subjective: 1 Day Post-Op Procedure(s) (LRB): LEFT TOTAL HIP ARTHROPLASTY ANTERIOR APPROACH (Left) Patient reports pain as moderate.    Objective: Vital signs in last 24 hours: Temp:  [96.8 F (36 C)-98.7 F (37.1 C)] 98.1 F (36.7 C) (12/01 1038) Pulse Rate:  [43-96] 84 (12/01 1038) Resp:  [8-25] 16 (12/01 0558) BP: (100-156)/(66-94) 156/77 (12/01 1038) SpO2:  [94 %-100 %] 95 % (12/01 1038)  Intake/Output from previous day: 11/30 0701 - 12/01 0700 In: 3507.5 [P.O.:700; I.V.:2807.5] Out: 2250 [Urine:1600; Blood:650] Intake/Output this shift: Total I/O In: 480 [P.O.:480] Out: 600 [Urine:600]  Recent Labs    07/13/17 0523  HGB 12.5*   Recent Labs    07/13/17 0523  WBC 18.0*  RBC 4.31  HCT 37.6*  PLT PLATELET CLUMPS NOTED ON SMEAR, COUNT APPEARS ADEQUATE   Recent Labs    07/13/17 0523  NA 134*  K 4.4  CL 100*  CO2 26  BUN 16  CREATININE 1.46*  GLUCOSE 127*  CALCIUM 8.8*   No results for input(s): LABPT, INR in the last 72 hours.  Sensation intact distally Intact pulses distally Dorsiflexion/Plantar flexion intact Incision: dressing C/D/I  Assessment/Plan: 1 Day Post-Op Procedure(s) (LRB): LEFT TOTAL HIP ARTHROPLASTY ANTERIOR APPROACH (Left) Up with therapy Plan for discharge tomorrow Discharge home with home health  Mcarthur Rossetti 07/13/2017, 1:23 PM

## 2017-07-14 ENCOUNTER — Other Ambulatory Visit: Payer: Self-pay | Admitting: Family Medicine

## 2017-07-14 NOTE — Plan of Care (Signed)
Plan of care reviewed.  Patient progressing well.

## 2017-07-14 NOTE — Progress Notes (Signed)
Physical Therapy Treatment Patient Details Name: Matthew Reese MRN: [id number] DOB: 1971-02-15 Today's Date: 07/14/2017    History of Present Illness s/p L THA;    PT Comments    Making excellent progress; verbally reviewed car transfers and stairs (pt has no stairs at home); feels ready to D/C today  Follow Up Recommendations  DC plan and follow up therapy as arranged by surgeon;Home health PT     Equipment Recommendations  Rolling walker with 5" wheels    Recommendations for Other Services       Precautions / Restrictions Precautions Precautions: Fall Restrictions Weight Bearing Restrictions: No Other Position/Activity Restrictions: WBAT    Mobility  Bed Mobility Overal bed mobility: Needs Assistance Bed Mobility: Sit to Supine     Supine to sit: Supervision;Modified independent (Device/Increase time)        Transfers Overall transfer level: Needs assistance Equipment used: Rolling walker (2 wheeled) Transfers: Sit to/from Stand Sit to Stand: Supervision         General transfer comment: cues for hand placement and LLE management  Ambulation/Gait Ambulation/Gait assistance: Supervision;Modified independent (Device/Increase time) Ambulation Distance (Feet): 160 Feet Assistive device: Rolling walker (2 wheeled) Gait Pattern/deviations: Step-to pattern;Decreased weight shift to left     General Gait Details: cues for gait progression   Stairs            Wheelchair Mobility    Modified Rankin (Stroke Patients Only)       Balance                                            Cognition Arousal/Alertness: Awake/alert Behavior During Therapy: WFL for tasks assessed/performed Overall Cognitive Status: Within Functional Limits for tasks assessed                                        Exercises Total Joint Exercises Ankle Circles/Pumps: AROM;Both;10 reps Hip ABduction/ADduction: AROM;Left;10  reps;Standing Knee Flexion: AROM;Left;10 reps;Standing Marching in Standing: AROM;5 reps;Standing Standing Hip Extension: AROM;10 reps;Left;Standing    General Comments        Pertinent Vitals/Pain Pain Assessment: 0-10 Pain Score: 3  Pain Location: left hip Pain Descriptors / Indicators: Sore;Tightness Pain Intervention(s): Limited activity within patient's tolerance;Monitored during session;Premedicated before session;Repositioned;Ice applied    Home Living                      Prior Function            PT Goals (current goals can now be found in the care plan section) Acute Rehab PT Goals Patient Stated Goal: home, less pain PT Goal Formulation: With patient Time For Goal Achievement: 07/19/17 Potential to Achieve Goals: Good Progress towards PT goals: Progressing toward goals    Frequency    7X/week      PT Plan Current plan remains appropriate    Co-evaluation              AM-PAC PT "6 Clicks" Daily Activity  Outcome Measure  Difficulty turning over in bed (including adjusting bedclothes, sheets and blankets)?: None Difficulty moving from lying on back to sitting on the side of the bed? : None Difficulty sitting down on and standing up from a chair with arms (e.g., wheelchair, bedside commode, etc,.)?: None Help  needed moving to and from a bed to chair (including a wheelchair)?: A Little Help needed walking in hospital room?: A Little Help needed climbing 3-5 steps with a railing? : A Little 6 Click Score: 21    End of Session   Activity Tolerance: Patient tolerated treatment well Patient left: with call bell/phone within reach;in chair   PT Visit Diagnosis: Difficulty in walking, not elsewhere classified (R26.2)     Time: 0623-7628 PT Time Calculation (min) (ACUTE ONLY): 25 min  Charges:  $Gait Training: 8-22 mins $Therapeutic Exercise: 8-22 mins                    G CodesKenyon Ana, PT Pager:  [phone] 07/14/2017    Kenyon Ana 07/14/2017, 10:30 AM

## 2017-07-14 NOTE — Progress Notes (Signed)
DC instructions and prescriptions given and explained to patient in detail. Patient and pt's spouse verbalized understanding

## 2017-07-14 NOTE — Care Management Note (Signed)
Case Management Note  Patient Details  Name: Matthew Reese MRN: [id number] Date of Birth: [birth year]  Subjective/Objective:       S/p L THA             Action/Plan: Discharge Planning: NCM spoke to pt at bedside. Offered choice for HH/list provided. Pt agreeable to [facility] for [facility] Requested RW and 3n1 bedside commode for home. Contacted [facility] rep for RW wide and tall and wide elongated bedside commode to be delivered to room prior to dc. Wife at home to assist with care.  PCP Laurey Morale MD  Expected Discharge Date:  07/14/17               Expected Discharge Plan:  [place]  In-House Referral:  NA  Discharge planning Services  CM Consult  Post Acute Care Choice:  Home Health Choice offered to:  Patient  DME Arranged:  3-N-1, Walker rolling DME Agency:  [place]:  PT [place] Agency:  [facility]  Status of Service:  Completed, signed off  If discussed at [employer] of Stay Meetings, dates discussed:    Additional Comments:  Erenest Rasher, RN 07/14/2017, 10:58 AM

## 2017-07-14 NOTE — Progress Notes (Signed)
Subjective: 2 Days Post-Op Procedure(s) (LRB): LEFT TOTAL HIP ARTHROPLASTY ANTERIOR APPROACH (Left) Patient reports pain as mild and moderate.    Objective: Vital signs in last 24 hours: Temp:  [98 F (36.7 C)-98.8 F (37.1 C)] 98.6 F (37 C) (12/02 0523) Pulse Rate:  [80-91] 91 (12/02 0523) Resp:  [16-17] 16 (12/02 0523) BP: (138-156)/(71-93) 138/88 (12/02 0523) SpO2:  [93 %-96 %] 93 % (12/02 0523)  Intake/Output from previous day: 12/01 0701 - 12/02 0700 In: 1152.8 [P.O.:1080; I.V.:72.8] Out: 600 [Urine:600] Intake/Output this shift: No intake/output data recorded.  Recent Labs    07/13/17 0523  HGB 12.5*   Recent Labs    07/13/17 0523  WBC 18.0*  RBC 4.31  HCT 37.6*  PLT PLATELET CLUMPS NOTED ON SMEAR, COUNT APPEARS ADEQUATE   Recent Labs    07/13/17 0523  NA 134*  K 4.4  CL 100*  CO2 26  BUN 16  CREATININE 1.46*  GLUCOSE 127*  CALCIUM 8.8*   No results for input(s): LABPT, INR in the last 72 hours.  Sensation intact distally Intact pulses distally Dorsiflexion/Plantar flexion intact Incision: dressing C/D/I  Assessment/Plan: 2 Days Post-Op Procedure(s) (LRB): LEFT TOTAL HIP ARTHROPLASTY ANTERIOR APPROACH (Left) Discharge home with home health  Biagio Borg 07/14/2017, 9:32 AM

## 2017-07-15 ENCOUNTER — Encounter (HOSPITAL_COMMUNITY): Payer: Self-pay | Admitting: Orthopaedic Surgery
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
## 2017-07-16 NOTE — Telephone Encounter (Signed)
Rx sent 

## 2017-07-16 NOTE — Telephone Encounter (Signed)
Call in a one month supply. He will need an OV and labs sometime soon

## 2017-07-16 NOTE — Telephone Encounter (Signed)
TSH was last tested 2017 and Rx was last refilled 04/2017 for a 90 day supply with no refills. Sent to PCP for approval to refill one time. Will call pt to advise that they need to have their TSH tested.

## 2017-07-16 NOTE — Telephone Encounter (Signed)
Needs OV to recheck TSH for more refills
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
## 2017-07-25 ENCOUNTER — Encounter (INDEPENDENT_AMBULATORY_CARE_PROVIDER_SITE_OTHER): Payer: Self-pay | Admitting: Orthopaedic Surgery

## 2017-07-25 ENCOUNTER — Ambulatory Visit (INDEPENDENT_AMBULATORY_CARE_PROVIDER_SITE_OTHER): Payer: BC Managed Care – PPO | Admitting: Orthopaedic Surgery

## 2017-07-25 DIAGNOSIS — Z96642 Presence of left artificial hip joint: Secondary | ICD-10-CM

## 2017-07-25 MED ORDER — OXYCODONE-ACETAMINOPHEN 5-325 MG PO TABS: 1.0000 | ORAL_TABLET | Freq: Four times a day (QID) | ORAL | 0 refills | Status: DC | PRN

## 2017-07-25 NOTE — Progress Notes (Signed)
Patient is now 2 weeks tomorrow status post a left total hip arthroplasty through direct anterior approach.  Exam of a cane is that he is doing well overall.  He has been on aspirin a day.  He does need a refill of his Percocet.  On exam his leg length seem to be equal.  His incision was cleared to remove staples apply Steri-Strips.  He does have a moderate seroma that drained.  There is no evidence of infection.  His calf is soft as well.  There is no foot swelling today but he says that he has had some foot swelling but it goes down after he goes to sleep.  At this point he is cleared to drive.  He understands not to drive when he has narcotic in the system.  I will see him back in 4 weeks see how he is doing overall.  Obviously if his seroma reaccumulated to the point where he like to have something done before then he can see his back earlier.  No x-rays are needed at his next visit.

## 2017-07-31 ENCOUNTER — Telehealth (INDEPENDENT_AMBULATORY_CARE_PROVIDER_SITE_OTHER): Payer: Self-pay

## 2017-07-31 NOTE — Telephone Encounter (Signed)
That can be from the stress of surgery and that we had him on aspirin.  He needs to stop any aspirin and any anti-inflammatory meds and take an over the counter prilosec daily.  If this continues, he needs to see his PCP

## 2017-07-31 NOTE — Telephone Encounter (Signed)
Patient called wanting someone to call him about some issues he has been having since surgery. States that he has been experiencing some bleeding in stools and it is dark blood. Please call to advise.  [phone]

## 2017-07-31 NOTE — Telephone Encounter (Signed)
Please advise 

## 2017-07-31 NOTE — Telephone Encounter (Signed)
LMOM for patient of the below message  

## 2017-08-10 ENCOUNTER — Other Ambulatory Visit (INDEPENDENT_AMBULATORY_CARE_PROVIDER_SITE_OTHER): Payer: Self-pay | Admitting: Orthopaedic Surgery

## 2017-08-10 NOTE — Telephone Encounter (Signed)
He does not need to continue it

## 2017-08-10 NOTE — Telephone Encounter (Signed)
Does patient need to continue this medication? 

## 2017-08-14 ENCOUNTER — Other Ambulatory Visit: Payer: Self-pay | Admitting: Family Medicine

## 2017-08-14 NOTE — Telephone Encounter (Signed)
Pt is due to have their TSH retested. Last tested in 2017

## 2017-08-14 NOTE — Telephone Encounter (Signed)
Last OV 10/19/2016. Rx was last refilled 07/16/2017. TSH last tested on 04/18/2016. Pt needs an OV or needs to schedule to have TSH retested sent to PCP for preference.

## 2017-08-14 NOTE — Telephone Encounter (Signed)
Sent to PCP for approval.  

## 2017-08-16 MED ORDER — SYNTHROID 200 MCG PO TABS: 200.0000 ug | ORAL_TABLET | Freq: Every day | ORAL | 0 refills | Status: DC

## 2017-08-16 NOTE — Telephone Encounter (Signed)
Call in a 30 day supply and set up a TSH soon

## 2017-08-16 NOTE — Telephone Encounter (Signed)
Call in a 30 day supply and set up for him to get a TSH soon

## 2017-08-20 ENCOUNTER — Ambulatory Visit (INDEPENDENT_AMBULATORY_CARE_PROVIDER_SITE_OTHER): Payer: BC Managed Care – PPO | Admitting: Family Medicine

## 2017-08-20 ENCOUNTER — Encounter: Payer: Self-pay | Admitting: Family Medicine

## 2017-08-20 VITALS — BP 124/82 | HR 84 | Temp 98.6°F | Ht 79.0 in | Wt 330.4 lb

## 2017-08-20 DIAGNOSIS — Z23 Encounter for immunization: Secondary | ICD-10-CM

## 2017-08-20 DIAGNOSIS — Z0000 Encounter for general adult medical examination without abnormal findings: Secondary | ICD-10-CM

## 2017-08-20 LAB — CBC WITH DIFFERENTIAL/PLATELET
BASOS ABS: 0.2 10*3/uL — AB (ref 0.0–0.1)
Basophils Relative: 1.9 % (ref 0.0–3.0)
EOS ABS: 0.4 10*3/uL (ref 0.0–0.7)
Eosinophils Relative: 3.6 % (ref 0.0–5.0)
HCT: 41.9 % (ref 39.0–52.0)
Hemoglobin: 13.8 g/dL (ref 13.0–17.0)
LYMPHS ABS: 3.7 10*3/uL (ref 0.7–4.0)
LYMPHS PCT: 37.2 % (ref 12.0–46.0)
MCHC: 33 g/dL (ref 30.0–36.0)
MCV: 84.7 fl (ref 78.0–100.0)
MONO ABS: 0.9 10*3/uL (ref 0.1–1.0)
Monocytes Relative: 8.7 % (ref 3.0–12.0)
NEUTROS ABS: 4.8 10*3/uL (ref 1.4–7.7)
NEUTROS PCT: 48.6 % (ref 43.0–77.0)
PLATELETS: 254 10*3/uL (ref 150.0–400.0)
RBC: 4.94 Mil/uL (ref 4.22–5.81)
RDW: 14.4 % (ref 11.5–15.5)
WBC: 10 10*3/uL (ref 4.0–10.5)

## 2017-08-20 LAB — POC URINALSYSI DIPSTICK (AUTOMATED)
BILIRUBIN UA: NEGATIVE
Glucose, UA: NEGATIVE
KETONES UA: NEGATIVE
Leukocytes, UA: NEGATIVE
Nitrite, UA: NEGATIVE
PH UA: 6 (ref 5.0–8.0)
Protein, UA: NEGATIVE
RBC UA: NEGATIVE
SPEC GRAV UA: 1.025 (ref 1.010–1.025)
Urobilinogen, UA: 0.2 E.U./dL

## 2017-08-20 LAB — BASIC METABOLIC PANEL
BUN: 18 mg/dL (ref 6–23)
CO2: 30 mEq/L (ref 19–32)
Calcium: 9.9 mg/dL (ref 8.4–10.5)
Chloride: 98 mEq/L (ref 96–112)
Creatinine, Ser: 1.38 mg/dL (ref 0.40–1.50)
GFR: 58.26 mL/min — AB (ref >60.00)
GLUCOSE: 104 mg/dL — AB (ref 70–99)
Potassium: 4.8 mEq/L (ref 3.5–5.1)
Sodium: 138 mEq/L (ref 135–145)

## 2017-08-20 LAB — LDL CHOLESTEROL, DIRECT: LDL DIRECT: 127 mg/dL

## 2017-08-20 LAB — HEPATIC FUNCTION PANEL
ALBUMIN: 4.5 g/dL (ref 3.5–5.2)
ALT: 50 U/L (ref 0–53)
AST: 44 U/L — AB (ref 0–37)
Alkaline Phosphatase: 77 U/L (ref 39–117)
Bilirubin, Direct: 0.1 mg/dL (ref 0.0–0.3)
Total Bilirubin: 0.6 mg/dL (ref 0.2–1.2)
Total Protein: 7.8 g/dL (ref 6.0–8.3)

## 2017-08-20 LAB — LIPID PANEL
CHOLESTEROL: 197 mg/dL (ref 0–200)
HDL: 34.5 mg/dL — AB (ref >39.00)
NonHDL: 162.58
Total CHOL/HDL Ratio: 6
Triglycerides: 244 mg/dL — ABNORMAL HIGH (ref 0.0–149.0)
VLDL: 48.8 mg/dL — AB (ref 0.0–40.0)

## 2017-08-20 LAB — TSH: TSH: 4.13 u[IU]/mL (ref 0.35–4.50)

## 2017-08-20 MED ORDER — SYNTHROID 200 MCG PO TABS: 200.0000 ug | ORAL_TABLET | Freq: Every day | ORAL | 11 refills | Status: DC

## 2017-08-20 MED ORDER — OMEPRAZOLE 40 MG PO CPDR: 40.0000 mg | DELAYED_RELEASE_CAPSULE | Freq: Every day | ORAL | 3 refills | Status: DC

## 2017-08-20 NOTE — Progress Notes (Signed)
   Subjective:    Patient ID: Matthew Reese, male    DOB: 01-Nov-1970, 47 y.o.   MRN: [id number]  HPI Here for a well exam. He is doing well.    Review of Systems  Constitutional: Negative.   HENT: Negative.   Eyes: Negative.   Respiratory: Negative.   Cardiovascular: Negative.   Gastrointestinal: Negative.   Genitourinary: Negative.   Musculoskeletal: Negative.   Skin: Negative.   Neurological: Negative.   Psychiatric/Behavioral: Negative.        Objective:   Physical Exam  Constitutional: He is oriented to person, place, and time. He appears well-developed and well-nourished. No distress.  HENT:  Head: Normocephalic and atraumatic.  Right Ear: External ear normal.  Left Ear: External ear normal.  Nose: Nose normal.  Mouth/Throat: Oropharynx is clear and moist. No oropharyngeal exudate.  Eyes: Conjunctivae and EOM are normal. Pupils are equal, round, and reactive to light. Right eye exhibits no discharge. Left eye exhibits no discharge. No scleral icterus.  Neck: Neck supple. No JVD present. No tracheal deviation present. No thyromegaly present.  Cardiovascular: Normal rate, regular rhythm, normal heart sounds and intact distal pulses. Exam reveals no gallop and no friction rub.  No murmur heard. Pulmonary/Chest: Effort normal and breath sounds normal. No respiratory distress. He has no wheezes. He has no rales. He exhibits no tenderness.  Abdominal: Soft. Bowel sounds are normal. He exhibits no distension and no mass. There is no tenderness. There is no rebound and no guarding.  Genitourinary: Penis normal. No penile tenderness.  Musculoskeletal: Normal range of motion. He exhibits no edema or tenderness.  Lymphadenopathy:    He has no cervical adenopathy.  Neurological: He is alert and oriented to person, place, and time. He has normal reflexes. No cranial nerve deficit. He exhibits normal muscle tone. Coordination normal.  Skin: Skin is warm and dry. No rash noted. He  is not diaphoretic. No erythema. No pallor.  Psychiatric: He has a normal mood and affect. His behavior is normal. Judgment and thought content normal.          Assessment & Plan:  Well exam. We discussed diet and exercise. Get fasting labs. Alysia Penna, MD

## 2017-08-22 ENCOUNTER — Ambulatory Visit (INDEPENDENT_AMBULATORY_CARE_PROVIDER_SITE_OTHER): Payer: BC Managed Care – PPO | Admitting: Orthopaedic Surgery

## 2017-08-22 ENCOUNTER — Encounter (INDEPENDENT_AMBULATORY_CARE_PROVIDER_SITE_OTHER): Payer: Self-pay | Admitting: Orthopaedic Surgery

## 2017-08-22 DIAGNOSIS — G8929 Other chronic pain: Secondary | ICD-10-CM

## 2017-08-22 DIAGNOSIS — M25562 Pain in left knee: Secondary | ICD-10-CM

## 2017-08-22 DIAGNOSIS — Z96642 Presence of left artificial hip joint: Secondary | ICD-10-CM

## 2017-08-22 MED ORDER — METHYLPREDNISOLONE ACETATE 40 MG/ML IJ SUSP
40.0000 mg | INTRAMUSCULAR | Status: AC | PRN
Start: 2017-08-22 — End: 2017-08-22
  Administered 2017-08-22: 40 mg via INTRA_ARTICULAR

## 2017-08-22 MED ORDER — LIDOCAINE HCL 1 % IJ SOLN
3.0000 mL | INTRAMUSCULAR | Status: AC | PRN
  Administered 2017-08-22: 3 mL
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
## 2017-08-23 ENCOUNTER — Ambulatory Visit: Payer: Self-pay | Admitting: *Deleted

## 2017-08-23 NOTE — Telephone Encounter (Signed)
Attempted to call pt back to give lab results. No answer at this time.

## 2017-09-13 ENCOUNTER — Ambulatory Visit: Payer: BC Managed Care – PPO | Admitting: Family Medicine

## 2017-09-13 ENCOUNTER — Other Ambulatory Visit (HOSPITAL_COMMUNITY)
Admission: RE | Admit: 2017-09-13 | Discharge: 2017-09-13 | Disposition: A | Discharge status: Home / Self Care | Payer: BC Managed Care – PPO | Source: Ambulatory Visit | Attending: Family Medicine | Admitting: Family Medicine

## 2017-09-13 ENCOUNTER — Encounter: Payer: Self-pay | Admitting: Family Medicine

## 2017-09-13 VITALS — BP 124/78 | HR 89 | Temp 98.6°F | Wt 335.0 lb

## 2017-09-13 DIAGNOSIS — L989 Disorder of the skin and subcutaneous tissue, unspecified: Secondary | ICD-10-CM | POA: Diagnosis not present

## 2017-09-13 NOTE — Progress Notes (Signed)
   Subjective:    Patient ID: CAMDYN BESKE, male    DOB: 1970-08-29, 47 y.o.   MRN: [id number]  HPI Here to remove a skin lesion from the right posterior thigh.    Review of Systems  Constitutional: Negative.   Cardiovascular: Negative.        Objective:   Physical Exam  Constitutional: He appears well-developed and well-nourished.  Cardiovascular: Normal rate, regular rhythm, normal heart sounds and intact distal pulses.  Pulmonary/Chest: Effort normal and breath sounds normal. No respiratory distress. He has no wheezes. He has no rales.  Skin:  Large papular fleshy lesion on the right posterior thigh, diameter of 1.4 cm          Assessment & Plan:  Removal of skin lesion. The area was cleansed with Betadine and LA was achieved using 2% lidocaine with epinephrine. An eliptical incision was made around the base with a scalpel, and the entire lesion was removed and sent to Pathology. The wound was closed with 3 sutures of 4-0 Ethilon. This was dressed with Neosporin and a Bandaid. He will return for suture removal in 10 days. Alysia Penna, MD

## 2017-09-24 ENCOUNTER — Ambulatory Visit: Payer: BC Managed Care – PPO | Admitting: Family Medicine

## 2017-09-25 ENCOUNTER — Encounter (INDEPENDENT_AMBULATORY_CARE_PROVIDER_SITE_OTHER): Payer: Self-pay | Admitting: Orthopaedic Surgery

## 2017-09-25 ENCOUNTER — Ambulatory Visit (INDEPENDENT_AMBULATORY_CARE_PROVIDER_SITE_OTHER): Payer: BC Managed Care – PPO

## 2017-09-25 ENCOUNTER — Ambulatory Visit (INDEPENDENT_AMBULATORY_CARE_PROVIDER_SITE_OTHER): Payer: BC Managed Care – PPO | Admitting: Orthopaedic Surgery

## 2017-09-25 DIAGNOSIS — M25562 Pain in left knee: Secondary | ICD-10-CM

## 2017-09-25 DIAGNOSIS — G8929 Other chronic pain: Secondary | ICD-10-CM

## 2017-09-25 NOTE — Progress Notes (Signed)
The patient is now just under 3 months status post a left total hip arthroplasty to treat severe post traumatic arthritis.  Been seen him for his knee as well on his left side.  He walks with a limp is been having knee pain for a long period of time.  This is likely related to a severe deformity of his left hip.  Place a steroid injection in his left knee that worked great but his pain is back again.  He denies any locking catching at all.  On examination of his left knee today his range of motion is full.  There is no effusion.  There is negative Lockman's and negative McMurray's exam.  X-rays of the left knee today show a normal-appearing left knee.  I do feel that his knee pain is related more to his hip and should get less as time progresses.  We will keep him on current level of light duty work until I see him back in 4 weeks.  No x-rays are needed.

## 2017-10-01 ENCOUNTER — Encounter: Payer: Self-pay | Admitting: Family Medicine

## 2017-10-01 DIAGNOSIS — R739 Hyperglycemia, unspecified: Secondary | ICD-10-CM

## 2017-10-04 NOTE — Telephone Encounter (Signed)
The order was placed.

## 2017-10-23 ENCOUNTER — Encounter (INDEPENDENT_AMBULATORY_CARE_PROVIDER_SITE_OTHER): Payer: Self-pay | Admitting: Orthopaedic Surgery

## 2017-10-23 ENCOUNTER — Ambulatory Visit (INDEPENDENT_AMBULATORY_CARE_PROVIDER_SITE_OTHER): Payer: BC Managed Care – PPO | Admitting: Orthopaedic Surgery

## 2017-10-23 DIAGNOSIS — Z96642 Presence of left artificial hip joint: Secondary | ICD-10-CM
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
## 2018-04-16 ENCOUNTER — Ambulatory Visit: Payer: BC Managed Care – PPO | Admitting: Family Medicine

## 2018-04-16 ENCOUNTER — Encounter: Payer: Self-pay | Admitting: Family Medicine

## 2018-04-16 VITALS — BP 134/88 | HR 91 | Temp 98.3°F | Ht 79.0 in | Wt 329.0 lb

## 2018-04-16 DIAGNOSIS — I10 Essential (primary) hypertension: Secondary | ICD-10-CM

## 2018-04-16 DIAGNOSIS — R739 Hyperglycemia, unspecified: Secondary | ICD-10-CM

## 2018-04-16 DIAGNOSIS — R079 Chest pain, unspecified: Secondary | ICD-10-CM

## 2018-04-16 DIAGNOSIS — E785 Hyperlipidemia, unspecified: Secondary | ICD-10-CM | POA: Diagnosis not present

## 2018-04-16 MED ORDER — METOPROLOL SUCCINATE ER 50 MG PO TB24: 50.0000 mg | ORAL_TABLET | Freq: Every day | ORAL | 3 refills | Status: DC

## 2018-04-16 NOTE — Progress Notes (Signed)
   Subjective:    Patient ID: Matthew Reese, male    DOB: October 10, 1970, 47 y.o.   MRN: [id number]  HPI Here for 3 weeks of intermittent chest pressure and pain that goes across the entire chest and down both arms. This pain may last anywhere from 30 minutes to 24 hours. It is not related to exertion. He gets SOB at times. He often feels lightheaded and weak, and his BP has been going up and down. Over the past few days the BP has been as high as 180 over 90. No pain or swelling in the feet or legs. His last labs in January showed a normal TSH.    Review of Systems  Constitutional: Positive for fatigue.  Respiratory: Positive for chest tightness and shortness of breath. Negative for cough.   Cardiovascular: Positive for chest pain. Negative for palpitations and leg swelling.  Neurological: Positive for light-headedness. Negative for dizziness and headaches.       Objective:   Physical Exam  Constitutional: He is oriented to person, place, and time. He appears well-developed and well-nourished. No distress.  Neck: No thyromegaly present.  Cardiovascular: Normal rate, regular rhythm, normal heart sounds and intact distal pulses.  EKG is normal   Pulmonary/Chest: Effort normal and breath sounds normal. No stridor. No respiratory distress. He has no wheezes. He has no rales.  Musculoskeletal: He exhibits no edema.  Lymphadenopathy:    He has no cervical adenopathy.  Neurological: He is alert and oriented to person, place, and time.          Assessment & Plan:  He is having bouts of high BP with chest pains, lightheadedness, and SOB. Begin treating this with Metoprolol succinate 50 mg daily. Check labs soon. He will follow up in 2 weeks. We will also set up a nuclear stress test to rule out ischemia.  Alysia Penna, MD

## 2018-04-17 ENCOUNTER — Telehealth (HOSPITAL_COMMUNITY): Payer: Self-pay | Admitting: *Deleted

## 2018-04-17 ENCOUNTER — Other Ambulatory Visit (INDEPENDENT_AMBULATORY_CARE_PROVIDER_SITE_OTHER): Payer: BC Managed Care – PPO

## 2018-04-17 DIAGNOSIS — R739 Hyperglycemia, unspecified: Secondary | ICD-10-CM | POA: Diagnosis not present

## 2018-04-17 DIAGNOSIS — E785 Hyperlipidemia, unspecified: Secondary | ICD-10-CM | POA: Diagnosis not present

## 2018-04-17 LAB — BASIC METABOLIC PANEL
BUN: 18 mg/dL (ref 6–23)
CO2: 32 mEq/L (ref 19–32)
Calcium: 9.7 mg/dL (ref 8.4–10.5)
Chloride: 99 mEq/L (ref 96–112)
Creatinine, Ser: 1.41 mg/dL (ref 0.40–1.50)
GFR: 56.67 mL/min — ABNORMAL LOW (ref >60.00)
GLUCOSE: 99 mg/dL (ref 70–99)
POTASSIUM: 4.8 meq/L (ref 3.5–5.1)
SODIUM: 138 meq/L (ref 135–145)

## 2018-04-17 LAB — LIPID PANEL
CHOL/HDL RATIO: 6
Cholesterol: 192 mg/dL (ref 0–200)
HDL: 32.5 mg/dL — ABNORMAL LOW (ref >39.00)
NonHDL: 159.16
Triglycerides: 272 mg/dL — ABNORMAL HIGH (ref 0.0–149.0)
VLDL: 54.4 mg/dL — AB (ref 0.0–40.0)

## 2018-04-17 LAB — HEPATIC FUNCTION PANEL
ALT: 57 U/L — ABNORMAL HIGH (ref 0–53)
AST: 57 U/L — AB (ref 0–37)
Albumin: 4.3 g/dL (ref 3.5–5.2)
Alkaline Phosphatase: 66 U/L (ref 39–117)
BILIRUBIN TOTAL: 0.9 mg/dL (ref 0.2–1.2)
Bilirubin, Direct: 0.2 mg/dL (ref 0.0–0.3)
Total Protein: 7.5 g/dL (ref 6.0–8.3)

## 2018-04-17 LAB — LDL CHOLESTEROL, DIRECT: Direct LDL: 120 mg/dL

## 2018-04-17 LAB — HEMOGLOBIN A1C: Hgb A1c MFr Bld: 6.4 % (ref 4.6–6.5)

## 2018-04-17 NOTE — Telephone Encounter (Signed)
Left message on voicemail per DPR in reference to upcoming appointment scheduled on 04/21/18 with detailed instructions given per Myocardial Perfusion Study Information Sheet for the test. LM to arrive 15 minutes early, and that it is imperative to arrive on time for appointment to keep from having the test rescheduled. If you need to cancel or reschedule your appointment, please call the office within 24 hours of your appointment. Failure to do so may result in a cancellation of your appointment, and a $50 no show fee. Phone number given for call back for any questions. Fauna Neuner Jacqueline    

## 2018-04-21 ENCOUNTER — Ambulatory Visit (HOSPITAL_COMMUNITY): Payer: BC Managed Care – PPO | Attending: Internal Medicine

## 2018-04-21 DIAGNOSIS — R079 Chest pain, unspecified: Secondary | ICD-10-CM | POA: Insufficient documentation

## 2018-04-21 DIAGNOSIS — R0609 Other forms of dyspnea: Secondary | ICD-10-CM | POA: Diagnosis not present

## 2018-04-21 MED ORDER — TECHNETIUM TC 99M TETROFOSMIN IV KIT
32.8000 | PACK | Freq: Once | INTRAVENOUS | Status: AC | PRN
  Administered 2018-04-21: 32.8 via INTRAVENOUS
  Filled 2018-04-21: qty 33

## 2018-04-21 MED ORDER — REGADENOSON 0.4 MG/5ML IV SOLN
0.4000 mg | Freq: Once | INTRAVENOUS | Status: AC
  Administered 2018-04-21: 0.4 mg via INTRAVENOUS

## 2018-04-23 ENCOUNTER — Encounter (HOSPITAL_COMMUNITY): Payer: BC Managed Care – PPO | Attending: Cardiovascular Disease

## 2018-04-23 ENCOUNTER — Encounter: Payer: Self-pay | Admitting: *Deleted

## 2018-04-23 LAB — MYOCARDIAL PERFUSION IMAGING
CHL CUP MPHR: 171 {beats}/min
CHL CUP NUCLEAR SDS: 0
CHL CUP NUCLEAR SRS: 0
CSEPED: 7 min
CSEPEW: 9.2 METS
LV dias vol: 112 mL (ref 62–150)
LV sys vol: 56 mL
Peak HR: 122 {beats}/min
Percent HR: 71 %
RPE: 19
Rest HR: 62 {beats}/min
SSS: 0
TID: 0.85

## 2018-04-23 MED ORDER — TECHNETIUM TC 99M TETROFOSMIN IV KIT
31.4000 | PACK | Freq: Once | INTRAVENOUS | Status: AC | PRN
  Administered 2018-04-23: 31.4 via INTRAVENOUS
  Filled 2018-04-23: qty 32

## 2018-04-24 ENCOUNTER — Other Ambulatory Visit: Payer: Self-pay | Admitting: *Deleted

## 2018-04-24 ENCOUNTER — Other Ambulatory Visit: Payer: Self-pay | Admitting: Family Medicine

## 2018-04-24 MED ORDER — EZETIMIBE 10 MG PO TABS: 10.0000 mg | ORAL_TABLET | Freq: Every day | ORAL | 3 refills | Status: DC

## 2018-04-28 ENCOUNTER — Encounter: Payer: Self-pay | Admitting: *Deleted

## 2018-04-30 ENCOUNTER — Ambulatory Visit: Payer: BC Managed Care – PPO | Admitting: Family Medicine

## 2018-04-30 ENCOUNTER — Ambulatory Visit (INDEPENDENT_AMBULATORY_CARE_PROVIDER_SITE_OTHER): Payer: BC Managed Care – PPO | Admitting: Orthopaedic Surgery

## 2018-04-30 ENCOUNTER — Ambulatory Visit (INDEPENDENT_AMBULATORY_CARE_PROVIDER_SITE_OTHER): Payer: Self-pay

## 2018-04-30 ENCOUNTER — Encounter (INDEPENDENT_AMBULATORY_CARE_PROVIDER_SITE_OTHER): Payer: Self-pay | Admitting: Orthopaedic Surgery

## 2018-04-30 ENCOUNTER — Encounter: Payer: Self-pay | Admitting: Family Medicine

## 2018-04-30 VITALS — BP 134/88 | HR 61 | Temp 97.9°F | Wt 325.4 lb

## 2018-04-30 DIAGNOSIS — Z96642 Presence of left artificial hip joint: Secondary | ICD-10-CM | POA: Diagnosis not present

## 2018-04-30 DIAGNOSIS — E785 Hyperlipidemia, unspecified: Secondary | ICD-10-CM

## 2018-04-30 DIAGNOSIS — I10 Essential (primary) hypertension: Secondary | ICD-10-CM

## 2018-04-30 DIAGNOSIS — K219 Gastro-esophageal reflux disease without esophagitis: Secondary | ICD-10-CM | POA: Diagnosis not present

## 2018-04-30 DIAGNOSIS — R079 Chest pain, unspecified: Secondary | ICD-10-CM | POA: Diagnosis not present

## 2018-04-30 MED ORDER — METOPROLOL SUCCINATE ER 100 MG PO TB24: 100.0000 mg | ORAL_TABLET | Freq: Every day | ORAL | 3 refills | Status: DC

## 2018-04-30 NOTE — Progress Notes (Signed)
   Subjective:    Patient ID: Matthew Reese, male    DOB: [birth year], 47 y.o.   MRN: [id number]  HPI Here to follow up. At our last visit he complained of chest pains, and we felt that high BP was contributing to these. He started on Metoprolol succinate 50 mg daily , and the BP has come down nicely. He had a stress test which showed no cardiac ischemia but he did have an exaggerated hypertensive response. He had labs showing high lipids so he was started on Zetia. His GERD is well controlled so he stayed on his once daily dosing of Omeprazole. He is changing his diet, and he has lost 4 lbs since the last visit here. He feels better and the chest pains have totally resolved.    Review of Systems  Respiratory: Negative.   Cardiovascular: Negative.   Neurological: Negative.        Objective:   Physical Exam  Constitutional: He is oriented to person, place, and time. He appears well-developed and well-nourished.  Cardiovascular: Normal rate, regular rhythm, normal heart sounds and intact distal pulses.  Pulmonary/Chest: Effort normal and breath sounds normal.  Musculoskeletal: He exhibits no edema.  Neurological: He is alert and oriented to person, place, and time.          Assessment & Plan:  His chest pains have resolved. His HTN is responding to Metoprolol but we will increase the dose to 100 mg daily. He is on Zetia for the lipids. His GERD is stable. We will recheck with labs in 90 days. Alysia Penna, MD
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
## 2018-07-09 ENCOUNTER — Other Ambulatory Visit: Payer: Self-pay | Admitting: Family Medicine

## 2018-07-30 ENCOUNTER — Encounter: Payer: Self-pay | Admitting: Family Medicine

## 2018-07-30 ENCOUNTER — Ambulatory Visit: Payer: BC Managed Care – PPO | Admitting: Family Medicine

## 2018-07-30 VITALS — BP 128/84 | HR 67 | Temp 98.4°F | Wt 336.6 lb

## 2018-07-30 DIAGNOSIS — J209 Acute bronchitis, unspecified: Secondary | ICD-10-CM

## 2018-07-30 DIAGNOSIS — E785 Hyperlipidemia, unspecified: Secondary | ICD-10-CM

## 2018-07-30 DIAGNOSIS — I10 Essential (primary) hypertension: Secondary | ICD-10-CM

## 2018-07-30 LAB — HEPATIC FUNCTION PANEL
ALBUMIN: 4.4 g/dL (ref 3.5–5.2)
ALT: 51 U/L (ref 0–53)
AST: 36 U/L (ref 0–37)
Alkaline Phosphatase: 62 U/L (ref 39–117)
Bilirubin, Direct: 0.1 mg/dL (ref 0.0–0.3)
Total Bilirubin: 0.5 mg/dL (ref 0.2–1.2)
Total Protein: 7.6 g/dL (ref 6.0–8.3)

## 2018-07-30 LAB — LIPID PANEL
CHOLESTEROL: 174 mg/dL (ref 0–200)
HDL: 37.8 mg/dL — AB (ref >39.00)
LDL CALC: 99 mg/dL (ref 0–99)
NonHDL: 136.37
Total CHOL/HDL Ratio: 5
Triglycerides: 188 mg/dL — ABNORMAL HIGH (ref 0.0–149.0)
VLDL: 37.6 mg/dL (ref 0.0–40.0)

## 2018-07-30 MED ORDER — SYNTHROID 200 MCG PO TABS: 200.0000 ug | ORAL_TABLET | Freq: Every day | ORAL | 3 refills | Status: DC

## 2018-07-30 MED ORDER — AZITHROMYCIN 250 MG PO TABS: ORAL_TABLET | ORAL | 0 refills | Status: DC

## 2018-07-30 NOTE — Progress Notes (Signed)
   Subjective:    Patient ID: Matthew Reese, male    DOB: [birth year], 47 y.o.   MRN: [id number]  HPI Here to follow up on BP and lipids, and he has been ill for 3 weeks with chest congestion and coughing up green sputum. No fever. On Mucinex. He started taking Zetia 3 months ago.    Review of Systems  Constitutional: Negative.   HENT: Positive for congestion. Negative for sinus pressure, sinus pain and sore throat.   Eyes: Negative.   Respiratory: Positive for cough.   Cardiovascular: Negative.        Objective:   Physical Exam Constitutional:      Appearance: Normal appearance.  HENT:     Right Ear: Tympanic membrane and ear canal normal.     Left Ear: Tympanic membrane and ear canal normal.     Nose: Nose normal.     Mouth/Throat:     Pharynx: Oropharynx is clear.  Eyes:     Conjunctiva/sclera: Conjunctivae normal.  Cardiovascular:     Rate and Rhythm: Normal rate and regular rhythm.     Pulses: Normal pulses.     Heart sounds: Normal heart sounds.  Pulmonary:     Effort: Pulmonary effort is normal. No respiratory distress.     Breath sounds: No stridor. No rales.     Comments: Scattered rhonchi and wheezes  Musculoskeletal:        General: No swelling.  Lymphadenopathy:     Cervical: No cervical adenopathy.  Neurological:     General: No focal deficit present.     Mental Status: He is alert and oriented to person, place, and time.           Assessment & Plan:  His HTN is now well controlled. Get fasting labs today to check lipids. Treat the bronchitis with a Zpack.  Alysia Penna, MD

## 2018-08-04 ENCOUNTER — Encounter: Payer: Self-pay | Admitting: *Deleted

## 2018-08-26 ENCOUNTER — Encounter: Payer: Self-pay | Admitting: Family Medicine

## 2018-09-11 ENCOUNTER — Other Ambulatory Visit: Payer: Self-pay | Admitting: Family Medicine

## 2018-10-19 ENCOUNTER — Other Ambulatory Visit: Payer: Self-pay | Admitting: Family Medicine

## 2019-04-22 ENCOUNTER — Other Ambulatory Visit: Payer: Self-pay | Admitting: Family Medicine

## 2019-06-03 ENCOUNTER — Encounter: Payer: Self-pay | Admitting: Family Medicine

## 2019-06-04 NOTE — Telephone Encounter (Signed)
Pt has been scheduled.  °

## 2019-06-09 ENCOUNTER — Other Ambulatory Visit: Payer: Self-pay

## 2019-06-09 ENCOUNTER — Ambulatory Visit: Payer: BC Managed Care – PPO | Admitting: Family Medicine

## 2019-06-09 ENCOUNTER — Encounter: Payer: Self-pay | Admitting: Family Medicine

## 2019-06-09 VITALS — BP 140/90 | HR 64 | Temp 98.3°F | Ht 79.0 in | Wt 345.6 lb

## 2019-06-09 DIAGNOSIS — E785 Hyperlipidemia, unspecified: Secondary | ICD-10-CM | POA: Diagnosis not present

## 2019-06-09 DIAGNOSIS — I10 Essential (primary) hypertension: Secondary | ICD-10-CM | POA: Diagnosis not present

## 2019-06-09 DIAGNOSIS — F419 Anxiety disorder, unspecified: Secondary | ICD-10-CM

## 2019-06-09 DIAGNOSIS — E039 Hypothyroidism, unspecified: Secondary | ICD-10-CM

## 2019-06-09 DIAGNOSIS — K219 Gastro-esophageal reflux disease without esophagitis: Secondary | ICD-10-CM

## 2019-06-09 LAB — BASIC METABOLIC PANEL
BUN: 16 mg/dL (ref 6–23)
CO2: 28 mEq/L (ref 19–32)
Calcium: 9.6 mg/dL (ref 8.4–10.5)
Chloride: 100 mEq/L (ref 96–112)
Creatinine, Ser: 1.29 mg/dL (ref 0.40–1.50)
GFR: 58.81 mL/min — ABNORMAL LOW (ref >60.00)
Glucose, Bld: 103 mg/dL — ABNORMAL HIGH (ref 70–99)
Potassium: 4.7 mEq/L (ref 3.5–5.1)
Sodium: 137 mEq/L (ref 135–145)

## 2019-06-09 LAB — CBC WITH DIFFERENTIAL/PLATELET
Basophils Absolute: 0.1 10*3/uL (ref 0.0–0.1)
Basophils Relative: 1.2 % (ref 0.0–3.0)
Eosinophils Absolute: 0.3 10*3/uL (ref 0.0–0.7)
Eosinophils Relative: 3.5 % (ref 0.0–5.0)
HCT: 44.5 % (ref 39.0–52.0)
Hemoglobin: 14.9 g/dL (ref 13.0–17.0)
Lymphocytes Relative: 34.9 % (ref 12.0–46.0)
Lymphs Abs: 3.1 10*3/uL (ref 0.7–4.0)
MCHC: 33.4 g/dL (ref 30.0–36.0)
MCV: 84.9 fl (ref 78.0–100.0)
Monocytes Absolute: 0.8 10*3/uL (ref 0.1–1.0)
Monocytes Relative: 9.1 % (ref 3.0–12.0)
Neutro Abs: 4.6 10*3/uL (ref 1.4–7.7)
Neutrophils Relative %: 51.3 % (ref 43.0–77.0)
Platelets: 235 10*3/uL (ref 150.0–400.0)
RBC: 5.24 Mil/uL (ref 4.22–5.81)
RDW: 15.1 % (ref 11.5–15.5)
WBC: 9 10*3/uL (ref 4.0–10.5)

## 2019-06-09 LAB — HEPATIC FUNCTION PANEL
ALT: 49 U/L (ref 0–53)
AST: 42 U/L — ABNORMAL HIGH (ref 0–37)
Albumin: 4.3 g/dL (ref 3.5–5.2)
Alkaline Phosphatase: 55 U/L (ref 39–117)
Bilirubin, Direct: 0.2 mg/dL (ref 0.0–0.3)
Total Bilirubin: 0.8 mg/dL (ref 0.2–1.2)
Total Protein: 7.4 g/dL (ref 6.0–8.3)

## 2019-06-09 LAB — LIPID PANEL
Cholesterol: 181 mg/dL (ref 0–200)
HDL: 34.9 mg/dL — ABNORMAL LOW (ref >39.00)
NonHDL: 145.71
Total CHOL/HDL Ratio: 5
Triglycerides: 214 mg/dL — ABNORMAL HIGH (ref 0.0–149.0)
VLDL: 42.8 mg/dL — ABNORMAL HIGH (ref 0.0–40.0)

## 2019-06-09 LAB — LDL CHOLESTEROL, DIRECT: Direct LDL: 102 mg/dL

## 2019-06-09 MED ORDER — FAMOTIDINE 20 MG PO TABS: 20.0000 mg | ORAL_TABLET | Freq: Every day | ORAL | 0 refills | Status: DC

## 2019-06-09 MED ORDER — EZETIMIBE 10 MG PO TABS: 10.0000 mg | ORAL_TABLET | Freq: Every day | ORAL | 3 refills | Status: DC

## 2019-06-09 MED ORDER — ESCITALOPRAM OXALATE 10 MG PO TABS: 10.0000 mg | ORAL_TABLET | Freq: Every day | ORAL | 2 refills | Status: DC

## 2019-06-09 NOTE — Progress Notes (Signed)
   Subjective:    Patient ID: Matthew Reese, male    DOB: 1971/02/04, 48 y.o.   MRN: [id number]  HPI Here for several issues. First he thinks his thyroid levels may be low again. He has gained 20 lbs in the past year and he feels tired all the time. His BP has been borderline high. He has occasional tight pains in the right side of the chest which he thinks may be related to GERD. These usually occur during stressful times at work. He takes Omeprazole 40 mg at bedtime. Also he wants some help with anxiety. He has always been an anxious person but lately he feels stressed all the time. He has trouble sleeping at night sometimes when he lies awake and worries about things. No depression symptoms.    Review of Systems  Constitutional: Positive for fatigue and unexpected weight change.  Respiratory: Negative.   Cardiovascular: Positive for chest pain. Negative for palpitations and leg swelling.  Gastrointestinal: Negative.   Neurological: Negative.   Psychiatric/Behavioral: Positive for sleep disturbance. Negative for agitation, behavioral problems, confusion, decreased concentration and dysphoric mood. The patient is nervous/anxious.        Objective:   Physical Exam Constitutional:      General: He is not in acute distress.    Appearance: He is obese.  Cardiovascular:     Rate and Rhythm: Normal rate and regular rhythm.     Pulses: Normal pulses.     Heart sounds: Normal heart sounds.  Pulmonary:     Effort: Pulmonary effort is normal.     Breath sounds: Normal breath sounds.  Abdominal:     General: Abdomen is flat. Bowel sounds are normal. There is no distension.     Palpations: Abdomen is soft. There is no mass.     Tenderness: There is no abdominal tenderness. There is no guarding or rebound.     Hernia: No hernia is present.  Lymphadenopathy:     Cervical: No cervical adenopathy.  Neurological:     General: No focal deficit present.     Mental Status: He is oriented to  person, place, and time.  Psychiatric:        Mood and Affect: Mood normal.        Behavior: Behavior normal.        Thought Content: Thought content normal.        Judgment: Judgment normal.           Assessment & Plan:  His thyroid levels are likely low, so we will check a full panel today. His HTN is borderline controlled but I think this will improve once we get his anxiety under better control. For the anxiety he will try Lexapro 10 mg daily. The chest pains are likely due to GERD so in addition to the night time Omeprazole he will try Pepcid 20 mg in the mornings.  Alysia Penna, MD

## 2019-06-11 LAB — T3, FREE: T3, Free: 2.9 pg/mL (ref 2.3–4.2)

## 2019-06-11 LAB — T4, FREE: Free T4: 1.04 ng/dL (ref 0.60–1.60)

## 2019-06-11 LAB — TSH: TSH: 3.29 u[IU]/mL (ref 0.35–4.50)

## 2019-06-23 ENCOUNTER — Other Ambulatory Visit: Payer: Self-pay | Admitting: Family Medicine

## 2019-07-01 ENCOUNTER — Other Ambulatory Visit: Payer: Self-pay | Admitting: Family Medicine

## 2019-07-02 NOTE — Telephone Encounter (Signed)
Okay for 90 day supply vs 30 day?

## 2019-07-16 ENCOUNTER — Other Ambulatory Visit: Payer: Self-pay | Admitting: Family Medicine

## 2019-09-04 ENCOUNTER — Ambulatory Visit (INDEPENDENT_AMBULATORY_CARE_PROVIDER_SITE_OTHER): Payer: BC Managed Care – PPO | Admitting: Medical

## 2019-09-04 ENCOUNTER — Encounter: Payer: Self-pay | Admitting: Medical

## 2019-09-04 ENCOUNTER — Encounter: Payer: Self-pay | Admitting: Family Medicine

## 2019-09-04 VITALS — BP 116/70 | HR 66 | Temp 98.6°F | Wt 327.6 lb

## 2019-09-04 DIAGNOSIS — R0602 Shortness of breath: Secondary | ICD-10-CM | POA: Diagnosis not present

## 2019-09-04 DIAGNOSIS — R05 Cough: Secondary | ICD-10-CM | POA: Diagnosis not present

## 2019-09-04 DIAGNOSIS — U071 COVID-19: Secondary | ICD-10-CM | POA: Diagnosis not present

## 2019-09-04 DIAGNOSIS — R059 Cough, unspecified: Secondary | ICD-10-CM

## 2019-09-04 MED ORDER — ALBUTEROL SULFATE HFA 108 (90 BASE) MCG/ACT IN AERS: 2.0000 | INHALATION_SPRAY | Freq: Four times a day (QID) | RESPIRATORY_TRACT | 0 refills | Status: DC | PRN
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
## 2019-09-04 NOTE — Patient Instructions (Addendum)
General recommendations: I recommend you rest, hydrate well with water and clear fluids throughout the day.   Drink enough water and clear fluids so that your urine is clear.   You can use Tylenol for pain or fever every 4-6 hours. You can use over the counter Delsym for cough. You can use over the counter Emetrol for nausea.    Consider using over the counter Emergen-C Immune + over the counter for the next week.  If you need any medications from the pharmacy, have a friend or family member pick them up from the pharmacy for you.  Have them drop off the medications at your home to keep you from having to go into the pharmacy and potentially exposure others.   If this is not possible, see if your pharmacy does home delivery.  Or worse case scenario, go through the drive through at your pharmacy, wear your mask, use hand sanitizer before touching anything in the drive through transaction, and limit interaction with the store personally, particularly staying > 6 feet apart.  Over the next few days if you are having worse trouble breathing, if you are very weak, have persistent fever 101 or higher consistently despite Tylenol, uncontrollable nausea and vomiting, or feel very dehydrated, then call or go to the emergency department.    If you have other questions or have other symptoms or questions you are concerned about then please make a virtual visit with your primary care provider.  Covid symptoms such as fatigue and cough can linger over 2 weeks, even after the initial fever, aches, chills, and other initial symptoms.     Self Quarantine: The CDC, Centers for Disease Control has recommended a self quarantine of 10 -14 days from the start of your illness until you are symptom-free including at least 24 hours of no symptoms including no fever, no shortness of breath, and no body aches and chills, by day 10 before returning to work or general contact with the public.  What does self quarantine  mean: avoiding contact with people as much as possible.   Particularly in your house, isolate your self from others in a separate room, wear a mask when possible in the room, particularly if coughing a lot.   Have others bring food, water, medications, etc., to your door, but avoid direct contact with your household contacts during this time to avoid spreading the infection to them.   If you have a separate bathroom and living quarters during the next 2 weeks away from others, that would be preferable.    If you can't completely isolate, then wear a mask, wash hands frequently with soap and water for at least 15 seconds, minimize close contact with others, and have a friend or family member check regularly from a distance to make sure you are not getting seriously worse.     You should not be going out in public, should not be going to stores, to work or other public places until all your symptoms have resolved and at least 10 days + 24 hours of no symptoms at all have transpired.   Ideally you should avoid contact with others for a full 10 days if possible.  One of the goals is to limit spread to high risk people; people that are older and elderly, people with multiple health issues like diabetes, heart disease, lung disease, and anybody that has weakened immune systems such as people with cancer or on immunosuppressive therapy.

## 2019-09-08 ENCOUNTER — Ambulatory Visit: Payer: BC Managed Care – PPO | Admitting: Family Medicine

## 2019-09-19 ENCOUNTER — Other Ambulatory Visit: Payer: Self-pay | Admitting: Medical

## 2019-09-29 ENCOUNTER — Encounter: Payer: Self-pay | Admitting: Family Medicine

## 2019-09-29 NOTE — Telephone Encounter (Signed)
Yes, increase the Lexapro to 20 mg daily. Call in #90 with 3 rf

## 2019-09-30 MED ORDER — ESCITALOPRAM OXALATE 20 MG PO TABS: 20.0000 mg | ORAL_TABLET | Freq: Every day | ORAL | 3 refills | Status: DC

## 2019-10-11 ENCOUNTER — Other Ambulatory Visit: Payer: Self-pay | Admitting: Family Medicine

## 2019-11-09 ENCOUNTER — Encounter: Payer: Self-pay | Admitting: Family Medicine

## 2019-11-10 NOTE — Telephone Encounter (Signed)
He will need an OV for this

## 2019-11-11 ENCOUNTER — Other Ambulatory Visit: Payer: Self-pay

## 2019-11-12 ENCOUNTER — Ambulatory Visit (INDEPENDENT_AMBULATORY_CARE_PROVIDER_SITE_OTHER): Payer: BC Managed Care – PPO | Admitting: Family Medicine

## 2019-11-12 ENCOUNTER — Encounter: Payer: Self-pay | Admitting: Family Medicine

## 2019-11-12 VITALS — BP 124/70 | HR 63 | Temp 99.2°F | Wt 336.8 lb

## 2019-11-12 DIAGNOSIS — W57XXXA Bitten or stung by nonvenomous insect and other nonvenomous arthropods, initial encounter: Secondary | ICD-10-CM | POA: Diagnosis not present

## 2019-11-12 DIAGNOSIS — S30861A Insect bite (nonvenomous) of abdominal wall, initial encounter: Secondary | ICD-10-CM | POA: Diagnosis not present

## 2019-11-12 MED ORDER — DOXYCYCLINE HYCLATE 100 MG PO CAPS: 100.0000 mg | ORAL_CAPSULE | Freq: Two times a day (BID) | ORAL | 0 refills | Status: AC

## 2019-11-12 NOTE — Progress Notes (Signed)
   Subjective:    Patient ID: Matthew Reese, male    DOB: 1971/06/27, 49 y.o.   MRN: [id number]  HPI Here to check a tick bite. About 6 days ago while in the shower he discovered a tick embedded in his skin in the left inguinal area. He pulled it out. Since then the area has been red but he feels fine in general.    Review of Systems  Constitutional: Negative.   Respiratory: Negative.   Cardiovascular: Negative.   Skin: Positive for color change.  Neurological: Negative.        Objective:   Physical Exam Constitutional:      Appearance: Normal appearance.  Cardiovascular:     Rate and Rhythm: Normal rate and regular rhythm.     Pulses: Normal pulses.     Heart sounds: Normal heart sounds.  Pulmonary:     Effort: Pulmonary effort is normal.     Breath sounds: Normal breath sounds.  Skin:    Comments: There is a 1 cm sized area of macular erythema in the left groin area   Neurological:     General: No focal deficit present.     Mental Status: He is alert and oriented to person, place, and time.           Assessment & Plan:  Tick bite, coever with Doxycycline for 10 days.  Alysia Penna, MD

## 2019-12-29 ENCOUNTER — Encounter: Payer: Self-pay | Admitting: Family Medicine

## 2019-12-29 MED ORDER — IBUPROFEN 800 MG PO TABS: 800.0000 mg | ORAL_TABLET | Freq: Four times a day (QID) | ORAL | 2 refills | Status: AC | PRN

## 2019-12-29 NOTE — Telephone Encounter (Signed)
Call this in to take every 6 hours prn pain, #120 with 2 rf

## 2019-12-29 NOTE — Telephone Encounter (Signed)
Please advise. Last filled in 2017

## 2020-01-10 ENCOUNTER — Other Ambulatory Visit: Payer: Self-pay | Admitting: Family Medicine

## 2020-04-07 ENCOUNTER — Other Ambulatory Visit: Payer: Self-pay

## 2020-04-07 ENCOUNTER — Other Ambulatory Visit: Payer: Self-pay | Admitting: Family Medicine

## 2020-04-08 ENCOUNTER — Encounter: Payer: Self-pay | Admitting: Family Medicine

## 2020-04-08 ENCOUNTER — Ambulatory Visit: Payer: BC Managed Care – PPO | Admitting: Family Medicine

## 2020-04-08 VITALS — BP 120/60 | HR 57 | Temp 98.7°F | Wt 338.8 lb

## 2020-04-08 DIAGNOSIS — E039 Hypothyroidism, unspecified: Secondary | ICD-10-CM | POA: Diagnosis not present

## 2020-04-08 DIAGNOSIS — E785 Hyperlipidemia, unspecified: Secondary | ICD-10-CM

## 2020-04-08 DIAGNOSIS — G4730 Sleep apnea, unspecified: Secondary | ICD-10-CM | POA: Diagnosis not present

## 2020-04-08 DIAGNOSIS — I10 Essential (primary) hypertension: Secondary | ICD-10-CM | POA: Diagnosis not present

## 2020-04-08 LAB — LIPID PANEL
Cholesterol: 194 mg/dL (ref <200)
HDL: 40 mg/dL (ref >=40)
LDL Cholesterol (Calc): 119 mg/dL (calc) — ABNORMAL HIGH
Non-HDL Cholesterol (Calc): 154 mg/dL (calc) — ABNORMAL HIGH (ref <130)
Total CHOL/HDL Ratio: 4.9 (calc) (ref <5.0)
Triglycerides: 248 mg/dL — ABNORMAL HIGH (ref <150)

## 2020-04-08 LAB — T4, FREE: Free T4: 1.1 ng/dL (ref 0.8–1.8)

## 2020-04-08 LAB — T3, FREE: T3, Free: 3.1 pg/mL (ref 2.3–4.2)

## 2020-04-08 LAB — TSH: TSH: 2.53 mIU/L (ref 0.40–4.50)

## 2020-04-08 MED ORDER — METOPROLOL SUCCINATE ER 100 MG PO TB24: 100.0000 mg | ORAL_TABLET | Freq: Every day | ORAL | 3 refills | Status: DC

## 2020-04-08 NOTE — Progress Notes (Signed)
° °  Subjective:    Patient ID: Matthew Reese, male    DOB: 20-Apr-1971, 49 y.o.   MRN: [id number]  HPI Here to follow up on lipids, HTN, and his thyroid medication. He also mentions feeling tired all the time. He has started using melatonin for sleep and he sleeps all night, but he wakes up feeling tired every day. His wife does say he snores constantly. He has never been evaluated for sleep apnea.    Review of Systems  Constitutional: Positive for fatigue.  Respiratory: Negative.   Cardiovascular: Negative.        Objective:   Physical Exam Constitutional:      Appearance: Normal appearance.  Cardiovascular:     Rate and Rhythm: Normal rate and regular rhythm.     Pulses: Normal pulses.     Heart sounds: Normal heart sounds.  Pulmonary:     Effort: Pulmonary effort is normal.     Breath sounds: Normal breath sounds.  Neurological:     Mental Status: He is alert.           Assessment & Plan:  His HTN is stable. Get fasting labs for lipids and thyroid levels. Refer for a sleep apnea evaluation. Alysia Penna, MD

## 2020-04-10 ENCOUNTER — Other Ambulatory Visit: Payer: Self-pay | Admitting: Family Medicine

## 2020-05-18 ENCOUNTER — Ambulatory Visit (INDEPENDENT_AMBULATORY_CARE_PROVIDER_SITE_OTHER): Payer: BC Managed Care – PPO | Admitting: Pulmonary Disease

## 2020-05-18 ENCOUNTER — Other Ambulatory Visit: Payer: Self-pay

## 2020-05-18 ENCOUNTER — Encounter: Payer: Self-pay | Admitting: Pulmonary Disease

## 2020-05-18 VITALS — BP 136/74 | HR 66 | Temp 98.4°F | Ht >= 80 in | Wt 344.0 lb

## 2020-05-18 DIAGNOSIS — G4733 Obstructive sleep apnea (adult) (pediatric): Secondary | ICD-10-CM | POA: Diagnosis not present

## 2020-05-18 MED ORDER — ESZOPICLONE 2 MG PO TABS: 2.0000 mg | ORAL_TABLET | Freq: Every evening | ORAL | 2 refills | Status: DC | PRN

## 2020-05-18 NOTE — Patient Instructions (Signed)
Sleep onset and sleep maintenance insomnia -Trial with Lunesta  Possible obstructive sleep apnea -Obtain home sleep study  Treatment options for sleep disordered breathing as discussed  We will update you with results as soon as studies reviewed  Follow-up in 3 months  Call with concerns Sleep Apnea Sleep apnea affects breathing during sleep. It causes breathing to stop for a short time or to become shallow. It can also increase the risk of:  Heart attack.  Stroke.  Being very overweight (obese).  Diabetes.  Heart failure.  Irregular heartbeat. The goal of treatment is to help you breathe normally again. What are the causes? There are three kinds of sleep apnea:  Obstructive sleep apnea. This is caused by a blocked or collapsed airway.  Central sleep apnea. This happens when the brain does not send the right signals to the muscles that control breathing.  Mixed sleep apnea. This is a combination of obstructive and central sleep apnea. The most common cause of this condition is a collapsed or blocked airway. This can happen if:  Your throat muscles are too relaxed.  Your tongue and tonsils are too large.  You are overweight.  Your airway is too small. What increases the risk?  Being overweight.  Smoking.  Having a small airway.  Being older.  Being male.  Drinking alcohol.  Taking medicines to calm yourself (sedatives or tranquilizers).  Having family members with the condition. What are the signs or symptoms?  Trouble staying asleep.  Being sleepy or tired during the day.  Getting angry a lot.  Loud snoring.  Headaches in the morning.  Not being able to focus your mind (concentrate).  Forgetting things.  Less interest in sex.  Mood swings.  Personality changes.  Feelings of sadness (depression).  Waking up a lot during the night to pee (urinate).  Dry mouth.  Sore throat. How is this diagnosed?  Your medical history.  A  physical exam.  A test that is done when you are sleeping (sleep study). The test is most often done in a sleep lab but may also be done at home. How is this treated?   Sleeping on your side.  Using a medicine to get rid of mucus in your nose (decongestant).  Avoiding the use of alcohol, medicines to help you relax, or certain pain medicines (narcotics).  Losing weight, if needed.  Changing your diet.  Not smoking.  Using a machine to open your airway while you sleep, such as: ? An oral appliance. This is a mouthpiece that shifts your lower jaw forward. ? A CPAP device. This device blows air through a mask when you breathe out (exhale). ? An EPAP device. This has valves that you put in each nostril. ? A BPAP device. This device blows air through a mask when you breathe in (inhale) and breathe out.  Having surgery if other treatments do not work. It is important to get treatment for sleep apnea. Without treatment, it can lead to:  High blood pressure.  Coronary artery disease.  In men, not being able to have an erection (impotence).  Reduced thinking ability. Follow these instructions at home: Lifestyle  Make changes that your doctor recommends.  Eat a healthy diet.  Lose weight if needed.  Avoid alcohol, medicines to help you relax, and some pain medicines.  Do not use any products that contain nicotine or tobacco, such as cigarettes, e-cigarettes, and chewing tobacco. If you need help quitting, ask your doctor. General instructions  Take over-the-counter and prescription medicines only as told by your doctor.  If you were given a machine to use while you sleep, use it only as told by your doctor.  If you are having surgery, make sure to tell your doctor you have sleep apnea. You may need to bring your device with you.  Keep all follow-up visits as told by your doctor. This is important. Contact a doctor if:  The machine that you were given to use during sleep  bothers you or does not seem to be working.  You do not get better.  You get worse. Get help right away if:  Your chest hurts.  You have trouble breathing in enough air.  You have an uncomfortable feeling in your back, arms, or stomach.  You have trouble talking.  One side of your body feels weak.  A part of your face is hanging down. These symptoms may be an emergency. Do not wait to see if the symptoms will go away. Get medical help right away. Call your local emergency services (911 in the [place] Do not drive yourself to the hospital. Summary  This condition affects breathing during sleep.  The most common cause is a collapsed or blocked airway.  The goal of treatment is to help you breathe normally while you sleep. This information is not intended to replace advice given to you by your health care provider. Make sure you discuss any questions you have with your health care provider. Document Revised: 05/16/2018 Document Reviewed: 03/25/2018 Elsevier Patient Education  [place].
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
## 2020-06-10 ENCOUNTER — Other Ambulatory Visit: Payer: Self-pay

## 2020-06-10 ENCOUNTER — Ambulatory Visit: Payer: BC Managed Care – PPO

## 2020-06-10 DIAGNOSIS — G4733 Obstructive sleep apnea (adult) (pediatric): Secondary | ICD-10-CM

## 2020-06-14 ENCOUNTER — Telehealth: Payer: Self-pay | Admitting: Pulmonary Disease

## 2020-06-14 DIAGNOSIS — G4733 Obstructive sleep apnea (adult) (pediatric): Secondary | ICD-10-CM

## 2020-06-14 NOTE — Telephone Encounter (Signed)
Call patient  Sleep study result  Date of study: 06/12/2020  Impression: Moderate obstructive sleep apnea Moderate oxygen desaturations  Recommendation: DME referral  Recommend CPAP therapy for moderate obstructive sleep apnea  Auto titrating CPAP with pressure settings of 5-20 will be appropriate  Encourage weight loss measures  Follow-up in the office 4 to 6 weeks following initiation of treatment

## 2020-06-14 NOTE — Telephone Encounter (Signed)
Called and spoke with pt letting him know the results of the HST and stated to him recommendations per AO. Pt verbalized understanding and stated he would like to schedule an OV with provider to further discuss results. appt has been scheduled for pt with [facility] 11/12. Nothing further needed.

## 2020-06-20 ENCOUNTER — Other Ambulatory Visit: Payer: Self-pay | Admitting: Family Medicine

## 2020-06-22 ENCOUNTER — Other Ambulatory Visit: Payer: Self-pay | Admitting: Family Medicine
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
## 2020-06-24 ENCOUNTER — Ambulatory Visit: Payer: BC Managed Care – PPO | Admitting: Primary Care

## 2020-06-24 ENCOUNTER — Other Ambulatory Visit: Payer: Self-pay

## 2020-06-24 ENCOUNTER — Encounter: Payer: Self-pay | Admitting: Primary Care

## 2020-06-24 VITALS — BP 128/76 | HR 56 | Temp 98.0°F | Ht >= 80 in | Wt 342.4 lb

## 2020-06-24 DIAGNOSIS — G4733 Obstructive sleep apnea (adult) (pediatric): Secondary | ICD-10-CM

## 2020-06-24 DIAGNOSIS — J329 Chronic sinusitis, unspecified: Secondary | ICD-10-CM | POA: Diagnosis not present

## 2020-06-24 DIAGNOSIS — G4730 Sleep apnea, unspecified: Secondary | ICD-10-CM | POA: Insufficient documentation

## 2020-06-24 NOTE — Patient Instructions (Addendum)
Recommendations: Use saline nasal spray twice a day   Referral: ENT re: sleep apnea/chronic sinusitis   Follow-up: 2-3 months with Dr. Ander Slade     Sleep Apnea Sleep apnea affects breathing during sleep. It causes breathing to stop for a short time or to become shallow. It can also increase the risk of:  Heart attack.  Stroke.  Being very overweight (obese).  Diabetes.  Heart failure.  Irregular heartbeat. The goal of treatment is to help you breathe normally again. What are the causes? There are three kinds of sleep apnea:  Obstructive sleep apnea. This is caused by a blocked or collapsed airway.  Central sleep apnea. This happens when the brain does not send the right signals to the muscles that control breathing.  Mixed sleep apnea. This is a combination of obstructive and central sleep apnea. The most common cause of this condition is a collapsed or blocked airway. This can happen if:  Your throat muscles are too relaxed.  Your tongue and tonsils are too large.  You are overweight.  Your airway is too small. What increases the risk?  Being overweight.  Smoking.  Having a small airway.  Being older.  Being male.  Drinking alcohol.  Taking medicines to calm yourself (sedatives or tranquilizers).  Having family members with the condition. What are the signs or symptoms?  Trouble staying asleep.  Being sleepy or tired during the day.  Getting angry a lot.  Loud snoring.  Headaches in the morning.  Not being able to focus your mind (concentrate).  Forgetting things.  Less interest in sex.  Mood swings.  Personality changes.  Feelings of sadness (depression).  Waking up a lot during the night to pee (urinate).  Dry mouth.  Sore throat. How is this diagnosed?  Your medical history.  A physical exam.  A test that is done when you are sleeping (sleep study). The test is most often done in a sleep lab but may also be done at  home. How is this treated?   Sleeping on your side.  Using a medicine to get rid of mucus in your nose (decongestant).  Avoiding the use of alcohol, medicines to help you relax, or certain pain medicines (narcotics).  Losing weight, if needed.  Changing your diet.  Not smoking.  Using a machine to open your airway while you sleep, such as: ? An oral appliance. This is a mouthpiece that shifts your lower jaw forward. ? A CPAP device. This device blows air through a mask when you breathe out (exhale). ? An EPAP device. This has valves that you put in each nostril. ? A BPAP device. This device blows air through a mask when you breathe in (inhale) and breathe out.  Having surgery if other treatments do not work. It is important to get treatment for sleep apnea. Without treatment, it can lead to:  High blood pressure.  Coronary artery disease.  In men, not being able to have an erection (impotence).  Reduced thinking ability. Follow these instructions at home: Lifestyle  Make changes that your doctor recommends.  Eat a healthy diet.  Lose weight if needed.  Avoid alcohol, medicines to help you relax, and some pain medicines.  Do not use any products that contain nicotine or tobacco, such as cigarettes, e-cigarettes, and chewing tobacco. If you need help quitting, ask your doctor. General instructions  Take over-the-counter and prescription medicines only as told by your doctor.  If you were given a machine to use  while you sleep, use it only as told by your doctor.  If you are having surgery, make sure to tell your doctor you have sleep apnea. You may need to bring your device with you.  Keep all follow-up visits as told by your doctor. This is important. Contact a doctor if:  The machine that you were given to use during sleep bothers you or does not seem to be working.  You do not get better.  You get worse. Get help right away if:  Your chest hurts.  You  have trouble breathing in enough air.  You have an uncomfortable feeling in your back, arms, or stomach.  You have trouble talking.  One side of your body feels weak.  A part of your face is hanging down. These symptoms may be an emergency. Do not wait to see if the symptoms will go away. Get medical help right away. Call your local emergency services (911 in the [place] Do not drive yourself to the hospital. Summary  This condition affects breathing during sleep.  The most common cause is a collapsed or blocked airway.  The goal of treatment is to help you breathe normally while you sleep. This information is not intended to replace advice given to you by your health care provider. Make sure you discuss any questions you have with your health care provider. Document Revised: 05/16/2018 Document Reviewed: 03/25/2018 Elsevier Patient Education  [place].

## 2020-06-24 NOTE — Assessment & Plan Note (Addendum)
-   HST 06/02/20 showed moderate OSA; AHI 20.4 with SpO2 low 68% - Discussed treatment options including weight loss, side sleeping position, oral appliance, CPAP therapy and referral to ENT for possible surgical interventions. We also reviewed risks of untreated sleep apnea including cardiovascular disease, arrthymias, stroke, HTN, diabetes and increased risk for accidents. - At this time he is reluctant to start CPAP therapy, agreeing to work on weight loss and would like to be referred to ENT for chronic sinusitis symptoms  - Plan follow-up in 2-3 months with Dr. Ander Slade after seeing ENT

## 2020-08-08 DIAGNOSIS — J343 Hypertrophy of nasal turbinates: Secondary | ICD-10-CM | POA: Insufficient documentation

## 2020-09-15 ENCOUNTER — Other Ambulatory Visit: Payer: Self-pay | Admitting: Family Medicine

## 2020-10-08 ENCOUNTER — Encounter: Payer: Self-pay | Admitting: Family Medicine

## 2020-10-10 ENCOUNTER — Other Ambulatory Visit: Payer: Self-pay

## 2020-10-10 MED ORDER — COLCHICINE 0.6 MG PO TABS: ORAL_TABLET | ORAL | 2 refills | Status: DC

## 2020-10-10 NOTE — Telephone Encounter (Signed)
Call in Colchicine 0.6 mg to take every 6 hours as needed for gout, #60 with 2 rf

## 2020-10-12 ENCOUNTER — Other Ambulatory Visit: Payer: Self-pay

## 2020-10-12 MED ORDER — COLCHICINE 0.6 MG PO TABS: ORAL_TABLET | ORAL | 1 refills | Status: DC

## 2020-10-13 ENCOUNTER — Other Ambulatory Visit: Payer: Self-pay

## 2020-10-13 MED ORDER — COLCHICINE 0.6 MG PO TABS: ORAL_TABLET | ORAL | 2 refills | Status: DC

## 2020-10-20 ENCOUNTER — Other Ambulatory Visit: Payer: Self-pay | Admitting: Family Medicine

## 2020-11-02 ENCOUNTER — Other Ambulatory Visit: Payer: Self-pay | Admitting: Otolaryngology

## 2020-11-07 ENCOUNTER — Other Ambulatory Visit: Payer: Self-pay | Admitting: Family Medicine

## 2020-11-15 ENCOUNTER — Other Ambulatory Visit: Payer: Self-pay

## 2020-11-15 ENCOUNTER — Encounter (HOSPITAL_BASED_OUTPATIENT_CLINIC_OR_DEPARTMENT_OTHER): Payer: Self-pay | Admitting: Otolaryngology

## 2020-11-21 ENCOUNTER — Encounter (HOSPITAL_BASED_OUTPATIENT_CLINIC_OR_DEPARTMENT_OTHER)
Admission: RE | Admit: 2020-11-21 | Discharge: 2020-11-21 | Disposition: A | Discharge status: Home / Self Care | Payer: BC Managed Care – PPO | Source: Ambulatory Visit | Attending: Otolaryngology | Admitting: Otolaryngology

## 2020-11-21 ENCOUNTER — Other Ambulatory Visit (HOSPITAL_COMMUNITY)
Admission: RE | Admit: 2020-11-21 | Discharge: 2020-11-21 | Disposition: A | Discharge status: Home / Self Care | Payer: BC Managed Care – PPO | Source: Ambulatory Visit | Attending: Otolaryngology | Admitting: Otolaryngology

## 2020-11-21 DIAGNOSIS — Z01812 Encounter for preprocedural laboratory examination: Secondary | ICD-10-CM | POA: Diagnosis present

## 2020-11-21 DIAGNOSIS — Z20822 Contact with and (suspected) exposure to covid-19: Secondary | ICD-10-CM | POA: Insufficient documentation

## 2020-11-21 DIAGNOSIS — Z01810 Encounter for preprocedural cardiovascular examination: Secondary | ICD-10-CM | POA: Diagnosis not present

## 2020-11-21 DIAGNOSIS — I10 Essential (primary) hypertension: Secondary | ICD-10-CM | POA: Diagnosis not present

## 2020-11-21 LAB — SARS CORONAVIRUS 2 (TAT 6-24 HRS): SARS Coronavirus 2: NEGATIVE

## 2020-11-23 ENCOUNTER — Ambulatory Visit (HOSPITAL_BASED_OUTPATIENT_CLINIC_OR_DEPARTMENT_OTHER)
Admission: RE | Admit: 2020-11-23 | Discharge: 2020-11-23 | Disposition: A | Discharge status: Home / Self Care | Payer: BC Managed Care – PPO | Source: Ambulatory Visit | Attending: Otolaryngology | Admitting: Otolaryngology

## 2020-11-23 ENCOUNTER — Encounter (HOSPITAL_BASED_OUTPATIENT_CLINIC_OR_DEPARTMENT_OTHER): Payer: Self-pay | Admitting: Otolaryngology

## 2020-11-23 ENCOUNTER — Ambulatory Visit (HOSPITAL_BASED_OUTPATIENT_CLINIC_OR_DEPARTMENT_OTHER): Payer: BC Managed Care – PPO | Admitting: Anesthesiology

## 2020-11-23 ENCOUNTER — Encounter (HOSPITAL_BASED_OUTPATIENT_CLINIC_OR_DEPARTMENT_OTHER)
Admission: RE | Disposition: A | Discharge status: Home / Self Care | Payer: Self-pay | Source: Ambulatory Visit | Attending: Otolaryngology

## 2020-11-23 ENCOUNTER — Other Ambulatory Visit: Payer: Self-pay

## 2020-11-23 DIAGNOSIS — Z79899 Other long term (current) drug therapy: Secondary | ICD-10-CM | POA: Diagnosis not present

## 2020-11-23 DIAGNOSIS — I10 Essential (primary) hypertension: Secondary | ICD-10-CM | POA: Diagnosis not present

## 2020-11-23 DIAGNOSIS — Z791 Long term (current) use of non-steroidal anti-inflammatories (NSAID): Secondary | ICD-10-CM | POA: Insufficient documentation

## 2020-11-23 DIAGNOSIS — J342 Deviated nasal septum: Secondary | ICD-10-CM

## 2020-11-23 DIAGNOSIS — Z87891 Personal history of nicotine dependence: Secondary | ICD-10-CM | POA: Diagnosis not present

## 2020-11-23 DIAGNOSIS — J343 Hypertrophy of nasal turbinates: Secondary | ICD-10-CM | POA: Diagnosis not present

## 2020-11-23 DIAGNOSIS — K219 Gastro-esophageal reflux disease without esophagitis: Secondary | ICD-10-CM | POA: Diagnosis not present

## 2020-11-23 DIAGNOSIS — Z833 Family history of diabetes mellitus: Secondary | ICD-10-CM | POA: Insufficient documentation

## 2020-11-23 DIAGNOSIS — G4730 Sleep apnea, unspecified: Secondary | ICD-10-CM | POA: Insufficient documentation

## 2020-11-23 DIAGNOSIS — Z803 Family history of malignant neoplasm of breast: Secondary | ICD-10-CM | POA: Insufficient documentation

## 2020-11-23 DIAGNOSIS — Z801 Family history of malignant neoplasm of trachea, bronchus and lung: Secondary | ICD-10-CM | POA: Insufficient documentation

## 2020-11-23 DIAGNOSIS — J329 Chronic sinusitis, unspecified: Secondary | ICD-10-CM | POA: Diagnosis not present

## 2020-11-23 DIAGNOSIS — Z8616 Personal history of COVID-19: Secondary | ICD-10-CM | POA: Diagnosis not present

## 2020-11-23 HISTORY — DX: Gastro-esophageal reflux disease without esophagitis: K21.9

## 2020-11-23 HISTORY — DX: Pneumonia, unspecified organism: J18.9

## 2020-11-23 HISTORY — PX: NASAL SEPTOPLASTY W/ TURBINOPLASTY: SHX2070

## 2020-11-23 HISTORY — DX: Depression, unspecified: F32.A

## 2020-11-23 HISTORY — DX: Anxiety disorder, unspecified: F41.9

## 2020-11-23 HISTORY — PX: SINUS ENDO W/FUSION: SHX777

## 2020-11-23 HISTORY — DX: Essential (primary) hypertension: I10

## 2020-11-23 HISTORY — DX: Sleep apnea, unspecified: G47.30

## 2020-11-23 SURGERY — SINUS SURGERY, ENDOSCOPIC, USING COMPUTER-ASSISTED NAVIGATION
Anesthesia: General | Site: Nose | Laterality: Bilateral

## 2020-11-23 MED ORDER — FENTANYL CITRATE (PF) 100 MCG/2ML IJ SOLN
INTRAMUSCULAR | Status: DC | PRN
  Administered 2020-11-23: 50 ug via INTRAVENOUS
  Administered 2020-11-23: 100 ug via INTRAVENOUS
  Administered 2020-11-23: 50 ug via INTRAVENOUS

## 2020-11-23 MED ORDER — LACTATED RINGERS IV SOLN: INTRAVENOUS | Status: DC

## 2020-11-23 MED ORDER — EPHEDRINE 5 MG/ML INJ
INTRAVENOUS | Status: AC
  Filled 2020-11-23: qty 10

## 2020-11-23 MED ORDER — PROPOFOL 10 MG/ML IV BOLUS
INTRAVENOUS | Status: AC
  Filled 2020-11-23: qty 20

## 2020-11-23 MED ORDER — FENTANYL CITRATE (PF) 100 MCG/2ML IJ SOLN
INTRAMUSCULAR | Status: AC
  Filled 2020-11-23: qty 2

## 2020-11-23 MED ORDER — LIDOCAINE-EPINEPHRINE 1 %-1:100000 IJ SOLN
INTRAMUSCULAR | Status: DC | PRN
  Administered 2020-11-23: 8 mL

## 2020-11-23 MED ORDER — ACETAMINOPHEN 500 MG PO TABS
ORAL_TABLET | ORAL | Status: AC
  Filled 2020-11-23: qty 2

## 2020-11-23 MED ORDER — OXYMETAZOLINE HCL 0.05 % NA SOLN
NASAL | Status: AC
  Filled 2020-11-23: qty 30

## 2020-11-23 MED ORDER — ACETAMINOPHEN 500 MG PO TABS
1000.0000 mg | ORAL_TABLET | Freq: Once | ORAL | Status: AC
  Administered 2020-11-23: 1000 mg via ORAL

## 2020-11-23 MED ORDER — EPHEDRINE SULFATE-NACL 50-0.9 MG/10ML-% IV SOSY
PREFILLED_SYRINGE | INTRAVENOUS | Status: DC | PRN
  Administered 2020-11-23 (×2): 10 mg via INTRAVENOUS

## 2020-11-23 MED ORDER — SUGAMMADEX SODIUM 500 MG/5ML IV SOLN
INTRAVENOUS | Status: AC
  Filled 2020-11-23: qty 5

## 2020-11-23 MED ORDER — CEFAZOLIN SODIUM-DEXTROSE 2-4 GM/100ML-% IV SOLN
INTRAVENOUS | Status: AC
  Filled 2020-11-23: qty 100

## 2020-11-23 MED ORDER — FENTANYL CITRATE (PF) 100 MCG/2ML IJ SOLN
25.0000 ug | INTRAMUSCULAR | Status: DC | PRN
  Administered 2020-11-23 (×2): 50 ug via INTRAVENOUS

## 2020-11-23 MED ORDER — OXYMETAZOLINE HCL 0.05 % NA SOLN
NASAL | Status: DC | PRN
  Administered 2020-11-23: 1 via TOPICAL

## 2020-11-23 MED ORDER — ARTIFICIAL TEARS OPHTHALMIC OINT
TOPICAL_OINTMENT | OPHTHALMIC | Status: DC | PRN
  Administered 2020-11-23: 1 via OPHTHALMIC

## 2020-11-23 MED ORDER — DEXAMETHASONE SODIUM PHOSPHATE 10 MG/ML IJ SOLN
INTRAMUSCULAR | Status: DC | PRN
  Administered 2020-11-23: 10 mg via INTRAVENOUS

## 2020-11-23 MED ORDER — MIDAZOLAM HCL 2 MG/2ML IJ SOLN
INTRAMUSCULAR | Status: DC | PRN
  Administered 2020-11-23: 2 mg via INTRAVENOUS

## 2020-11-23 MED ORDER — MUPIROCIN 2 % EX OINT
TOPICAL_OINTMENT | CUTANEOUS | Status: DC | PRN
  Administered 2020-11-23: 1 via NASAL

## 2020-11-23 MED ORDER — ONDANSETRON HCL 4 MG/2ML IJ SOLN
INTRAMUSCULAR | Status: DC | PRN
  Administered 2020-11-23 (×2): 4 mg via INTRAVENOUS

## 2020-11-23 MED ORDER — MUPIROCIN 2 % EX OINT
TOPICAL_OINTMENT | CUTANEOUS | Status: AC
  Filled 2020-11-23: qty 22

## 2020-11-23 MED ORDER — ONDANSETRON HCL 4 MG/2ML IJ SOLN
INTRAMUSCULAR | Status: AC
  Filled 2020-11-23: qty 2

## 2020-11-23 MED ORDER — MIDAZOLAM HCL 2 MG/2ML IJ SOLN
INTRAMUSCULAR | Status: AC
  Filled 2020-11-23: qty 2

## 2020-11-23 MED ORDER — MUPIROCIN CALCIUM 2 % EX CREA
TOPICAL_CREAM | CUTANEOUS | Status: AC
  Filled 2020-11-23: qty 15

## 2020-11-23 MED ORDER — ROCURONIUM BROMIDE 10 MG/ML (PF) SYRINGE
PREFILLED_SYRINGE | INTRAVENOUS | Status: AC
  Filled 2020-11-23: qty 10

## 2020-11-23 MED ORDER — PROPOFOL 10 MG/ML IV BOLUS
INTRAVENOUS | Status: DC | PRN
  Administered 2020-11-23: 50 mg via INTRAVENOUS
  Administered 2020-11-23: 100 mg via INTRAVENOUS
  Administered 2020-11-23: 200 mg via INTRAVENOUS
  Administered 2020-11-23: 50 mg via INTRAVENOUS

## 2020-11-23 MED ORDER — CEPHALEXIN 500 MG PO CAPS: 500.0000 mg | ORAL_CAPSULE | Freq: Three times a day (TID) | ORAL | 0 refills | Status: AC

## 2020-11-23 MED ORDER — DEXAMETHASONE SODIUM PHOSPHATE 10 MG/ML IJ SOLN
INTRAMUSCULAR | Status: AC
  Filled 2020-11-23: qty 1

## 2020-11-23 MED ORDER — CEFAZOLIN SODIUM-DEXTROSE 1-4 GM/50ML-% IV SOLN
INTRAVENOUS | Status: AC
  Filled 2020-11-23: qty 50

## 2020-11-23 MED ORDER — BACITRACIN ZINC 500 UNIT/GM EX OINT
TOPICAL_OINTMENT | CUTANEOUS | Status: AC
  Filled 2020-11-23: qty 28.35

## 2020-11-23 MED ORDER — LIDOCAINE 2% (20 MG/ML) 5 ML SYRINGE
INTRAMUSCULAR | Status: AC
  Filled 2020-11-23: qty 5

## 2020-11-23 MED ORDER — CEFAZOLIN IN SODIUM CHLORIDE 3-0.9 GM/100ML-% IV SOLN
3.0000 g | INTRAVENOUS | Status: AC
  Administered 2020-11-23: 3 g via INTRAVENOUS
  Filled 2020-11-23: qty 100

## 2020-11-23 MED ORDER — LIDOCAINE 2% (20 MG/ML) 5 ML SYRINGE
INTRAMUSCULAR | Status: DC | PRN
  Administered 2020-11-23: 100 mg via INTRAVENOUS

## 2020-11-23 MED ORDER — LIDOCAINE-EPINEPHRINE 1 %-1:100000 IJ SOLN
INTRAMUSCULAR | Status: AC
  Filled 2020-11-23: qty 1

## 2020-11-23 MED ORDER — SUGAMMADEX SODIUM 200 MG/2ML IV SOLN
INTRAVENOUS | Status: DC | PRN
  Administered 2020-11-23: 310 mg via INTRAVENOUS

## 2020-11-23 MED ORDER — DEXMEDETOMIDINE (PRECEDEX) IN NS 20 MCG/5ML (4 MCG/ML) IV SYRINGE
PREFILLED_SYRINGE | INTRAVENOUS | Status: DC | PRN
  Administered 2020-11-23 (×4): 4 ug via INTRAVENOUS

## 2020-11-23 MED ORDER — ROCURONIUM BROMIDE 10 MG/ML (PF) SYRINGE
PREFILLED_SYRINGE | INTRAVENOUS | Status: DC | PRN
  Administered 2020-11-23: 100 mg via INTRAVENOUS

## 2020-11-23 SURGICAL SUPPLY — 59 items
ATTRACTOMAT 16X20 MAGNETIC DRP (DRAPES) IMPLANT
BLADE RAD40 ROTATE 4M 4 5PK (BLADE) IMPLANT
BLADE RAD60 ROTATE M4 4 5PK (BLADE) IMPLANT
BLADE ROTATE RAD 12 4 M4 (BLADE) IMPLANT
BLADE ROTATE RAD 40 4 M4 (BLADE) IMPLANT
BLADE ROTATE TRICUT 4X13 M4 (BLADE) ×2 IMPLANT
BLADE SURG 15 STRL LF DISP TIS (BLADE) IMPLANT
BLADE SURG 15 STRL SS (BLADE)
BLADE TRICUT ROTATE M4 4 5PK (BLADE) IMPLANT
BUR HS RAD FRONTAL 3 (BURR) IMPLANT
BUR TAPER CHOANAL ATRESIA 30K (BURR) IMPLANT
CANISTER SUC SOCK COL 7IN (MISCELLANEOUS) ×2 IMPLANT
CANISTER SUCT 1200ML W/VALVE (MISCELLANEOUS) ×4 IMPLANT
COAGULATOR SUCT 8FR VV (MISCELLANEOUS) ×1 IMPLANT
COAGULATOR SUCT SWTCH 10FR 6 (ELECTROSURGICAL) IMPLANT
COVER WAND RF STERILE (DRAPES) IMPLANT
DECANTER SPIKE VIAL GLASS SM (MISCELLANEOUS) IMPLANT
DRESSING NASAL KENNEDY 3.5X.9 (MISCELLANEOUS) IMPLANT
DRSG NASAL KENNEDY 3.5X.9 (MISCELLANEOUS)
DRSG NASOPORE 8CM (GAUZE/BANDAGES/DRESSINGS) IMPLANT
DRSG TELFA 3X8 NADH (GAUZE/BANDAGES/DRESSINGS) IMPLANT
ELECT COATED BLADE 2.86 ST (ELECTRODE) IMPLANT
ELECT REM PT RETURN 9FT ADLT (ELECTROSURGICAL) ×2
ELECTRODE REM PT RTRN 9FT ADLT (ELECTROSURGICAL) IMPLANT
GLOVE SURG ENC TEXT LTX SZ7 (GLOVE) ×4 IMPLANT
GOWN STRL REUS W/ TWL LRG LVL3 (GOWN DISPOSABLE) ×2 IMPLANT
GOWN STRL REUS W/TWL LRG LVL3 (GOWN DISPOSABLE) ×4
IV NS 1000ML (IV SOLUTION)
IV NS 1000ML BAXH (IV SOLUTION) IMPLANT
IV NS 500ML (IV SOLUTION) ×2
IV NS 500ML BAXH (IV SOLUTION) ×1 IMPLANT
IV SET EXT 30 76VOL 4 MALE LL (IV SETS) ×2 IMPLANT
NDL PRECISIONGLIDE 27X1.5 (NEEDLE) ×1 IMPLANT
NDL SPNL 25GX3.5 QUINCKE BL (NEEDLE) IMPLANT
NEEDLE PRECISIONGLIDE 27X1.5 (NEEDLE) ×2 IMPLANT
NEEDLE SPNL 25GX3.5 QUINCKE BL (NEEDLE) IMPLANT
NS IRRIG 1000ML POUR BTL (IV SOLUTION) IMPLANT
PACK BASIN DAY SURGERY FS (CUSTOM PROCEDURE TRAY) ×2 IMPLANT
PACK ENT DAY SURGERY (CUSTOM PROCEDURE TRAY) ×2 IMPLANT
PAD DRESSING TELFA 3X8 NADH (GAUZE/BANDAGES/DRESSINGS) IMPLANT
PENCIL SMOKE EVACUATOR (MISCELLANEOUS) IMPLANT
SLEEVE SCD COMPRESS KNEE MED (STOCKING) ×1 IMPLANT
SOLUTION BUTLER CLEAR DIP (MISCELLANEOUS) ×2 IMPLANT
SPLINT NASAL AIRWAY SILICONE (MISCELLANEOUS) ×2 IMPLANT
SPONGE GAUZE 2X2 8PLY STRL LF (GAUZE/BANDAGES/DRESSINGS) ×2 IMPLANT
SPONGE NEURO XRAY DETECT 1X3 (DISPOSABLE) ×2 IMPLANT
SPONGE SURGIFOAM ABS GEL 12-7 (HEMOSTASIS) IMPLANT
SUT ETHILON 3 0 PS 1 (SUTURE) ×2 IMPLANT
SUT PLAIN 4 0 ~~LOC~~ 1 (SUTURE) ×2 IMPLANT
SUT SILK 3 0 PS 1 (SUTURE) IMPLANT
SYR 3ML 23GX1 SAFETY (SYRINGE) IMPLANT
TOWEL GREEN STERILE FF (TOWEL DISPOSABLE) ×4 IMPLANT
TRACKER ENT INSTRUMENT (MISCELLANEOUS) ×2 IMPLANT
TRACKER ENT PATIENT (MISCELLANEOUS) ×2 IMPLANT
TUBE CONNECTING 20X1/4 (TUBING) ×2 IMPLANT
TUBE SALEM SUMP 12R W/ARV (TUBING) IMPLANT
TUBE SALEM SUMP 16 FR W/ARV (TUBING) ×1 IMPLANT
TUBING STRAIGHTSHOT EPS 5PK (TUBING) ×2 IMPLANT
YANKAUER SUCT BULB TIP NO VENT (SUCTIONS) ×2 IMPLANT

## 2020-11-23 NOTE — Anesthesia Procedure Notes (Signed)
Procedure Name: Intubation Date/Time: 11/23/2020 10:08 AM Performed by: Genelle Bal, CRNA Pre-anesthesia Checklist: Patient identified, Emergency Drugs available, Suction available and Patient being monitored Patient Re-evaluated:Patient Re-evaluated prior to induction Oxygen Delivery Method: Circle system utilized Preoxygenation: Pre-oxygenation with 100% oxygen Induction Type: IV induction Ventilation: Mask ventilation without difficulty Laryngoscope Size: Miller and 2 Grade View: Grade I Tube type: Oral Tube size: 8.0 mm Number of attempts: 1 Airway Equipment and Method: Stylet and Oral airway Placement Confirmation: ETT inserted through vocal cords under direct vision,  positive ETCO2 and breath sounds checked- equal and bilateral Secured at: 23 cm Tube secured with: Tape Dental Injury: Teeth and Oropharynx as per pre-operative assessment

## 2020-11-23 NOTE — Anesthesia Preprocedure Evaluation (Addendum)
Anesthesia Evaluation  Patient identified by MRN, date of birth, ID band Patient awake    Reviewed: Allergy & Precautions, NPO status , Patient's Chart, lab work & pertinent test results, reviewed documented beta blocker date and time   Airway Mallampati: I  TM Distance: >3 FB Neck ROM: Full    Dental no notable dental hx. (+) Teeth Intact, Dental Advisory Given   Pulmonary sleep apnea (does not use CPAP) , former smoker,  08/2020 Covid pneumonia   Pulmonary exam normal breath sounds clear to auscultation       Cardiovascular hypertension, Pt. on home beta blockers and Pt. on medications Normal cardiovascular exam Rhythm:Regular Rate:Normal  Stress Test 2019 negative   Neuro/Psych  Headaches, PSYCHIATRIC DISORDERS Anxiety Depression    GI/Hepatic Neg liver ROS, GERD  Controlled and Medicated,  Endo/Other  Hypothyroidism   Renal/GU negative Renal ROS  negative genitourinary   Musculoskeletal negative musculoskeletal ROS (+)   Abdominal   Peds  Hematology negative hematology ROS (+)   Anesthesia Other Findings   Reproductive/Obstetrics                            Anesthesia Physical Anesthesia Plan  ASA: III  Anesthesia Plan: General   Post-op Pain Management:    Induction: Intravenous  PONV Risk Score and Plan: 2 and Midazolam, Dexamethasone and Ondansetron  Airway Management Planned: Oral ETT  Additional Equipment:   Intra-op Plan:   Post-operative Plan: Extubation in OR  Informed Consent: I have reviewed the patients History and Physical, chart, labs and discussed the procedure including the risks, benefits and alternatives for the proposed anesthesia with the patient or authorized representative who has indicated his/her understanding and acceptance.     Dental advisory given  Plan Discussed with: CRNA  Anesthesia Plan Comments:         Anesthesia Quick  Evaluation
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
## 2020-11-23 NOTE — Discharge Instructions (Signed)
*  No Tylenol until 2:30pm  Post Anesthesia Home Care Instructions  Activity: Get plenty of rest for the remainder of the day. A responsible individual must stay with you for 24 hours following the procedure.  For the next 24 hours, DO NOT: -Drive a car -Paediatric nurse -Drink alcoholic beverages -Take any medication unless instructed by your physician -Make any legal decisions or sign important papers.  Meals: Start with liquid foods such as gelatin or soup. Progress to regular foods as tolerated. Avoid greasy, spicy, heavy foods. If nausea and/or vomiting occur, drink only clear liquids until the nausea and/or vomiting subsides. Call your physician if vomiting continues.  Special Instructions/Symptoms: Your throat may feel dry or sore from the anesthesia or the breathing tube placed in your throat during surgery. If this causes discomfort, gargle with warm salt water. The discomfort should disappear within 24 hours.  If you had a scopolamine patch placed behind your ear for the management of post- operative nausea and/or vomiting:  1. The medication in the patch is effective for 72 hours, after which it should be removed.  Wrap patch in a tissue and discard in the trash. Wash hands thoroughly with soap and water. 2. You may remove the patch earlier than 72 hours if you experience unpleasant side effects which may include dry mouth, dizziness or visual disturbances. 3. Avoid touching the patch. Wash your hands with soap and water after contact with the patch.

## 2020-11-23 NOTE — Transfer of Care (Signed)
Immediate Anesthesia Transfer of Care Note  Patient: Matthew Reese  Procedure(s) Performed: ENDOSCOPIC SINUS SURGERY WITH NAVIGATION (Bilateral Nose) NASAL SEPTOPLASTY WITH TURBINATE REDUCTION (Bilateral Nose)  Patient Location: PACU  Anesthesia Type:General  Level of Consciousness: awake, alert  and oriented  Airway & Oxygen Therapy: Patient Spontanous Breathing and Patient connected to face mask oxygen  Post-op Assessment: Report given to RN and Post -op Vital signs reviewed and stable  Post vital signs: Reviewed and stable  Last Vitals:  Vitals Value Taken Time  BP 160/86 11/23/20 1200  Temp    Pulse 65 11/23/20 1203  Resp 12 11/23/20 1203  SpO2 96 % 11/23/20 1203  Vitals shown include unvalidated device data.  Last Pain:  Vitals:   11/23/20 0827  TempSrc: Oral  PainSc: 0-No pain         Complications: No complications documented.

## 2020-11-23 NOTE — Anesthesia Postprocedure Evaluation (Signed)
Anesthesia Post Note  Patient: Matthew Reese  Procedure(s) Performed: ENDOSCOPIC SINUS SURGERY WITH NAVIGATION (Bilateral Nose) NASAL SEPTOPLASTY WITH TURBINATE REDUCTION (Bilateral Nose)     Patient location during evaluation: PACU Anesthesia Type: General Level of consciousness: awake and alert Pain management: pain level controlled Vital Signs Assessment: post-procedure vital signs reviewed and stable Respiratory status: spontaneous breathing, nonlabored ventilation, respiratory function stable and patient connected to nasal cannula oxygen Cardiovascular status: blood pressure returned to baseline and stable Postop Assessment: no apparent nausea or vomiting Anesthetic complications: no   No complications documented.  Last Vitals:  Vitals:   11/23/20 1315 11/23/20 1347  BP: (!) 165/89 (!) 169/90  Pulse: (!) 59 60  Resp: (!) 0   Temp:  36.7 C  SpO2: 94% 94%    Last Pain:  Vitals:   11/23/20 1347  TempSrc:   PainSc: 4                  Kaiyah Eber L Paitynn Mikus

## 2020-11-23 NOTE — Op Note (Signed)
Operative Note:  ENDOSCOPIC SINUS SURGERY WITH NAVIGATION    SEPTOPLASTY    INFERIOR TURBINATE REDUCTION  Patient: Matthew Reese  Medical record number: [id number]  Date:11/23/2020  Pre-operative Indications: 1.  Chronic Sinusitis     2.  Severe nasal septal deviation     3.  Bilateral inferior turbinate hypertrophy  Postoperative Indications: Same  Surgical Procedure: 1.  Bilateral endoscopic sinus surgery with intraoperative computer-assisted navigation (fusion) consisting of: Bilateral anterior ethmoidectomy, bilateral maxillary antrostomy with removal of tissue and bilateral nasal frontal recess exploration.    2.  Nasal septoplasty    3. Bilateral Inferior Turbinate Reduction  Anesthesia: GET  Surgeon: Delsa Bern, M.D.  Complications: None  EBL: 100 cc  Findings: Severely deviated nasal septum and bilateral inferior turbinate hypertrophy.  No nasal packing placed.  Bilateral Doyle nasal septal splints placed at the conclusion of the surgical procedure.   Brief History: The patient is a 50 y.o. male with a history of chronic sinusitis and turbinate hypertrophy. The patient has been on medical therapy to reduce nasal mucosal edema and infection including antibiotics, saline nasal spray and topical nasal steroids. Despite appropriate medical therapy the patient continues to have ongoing symptoms. Given the patient's history and findings, the above surgical procedures were recommended, risks and benefits were discussed in detail with the patient may understand and agree with our plan for surgery which is scheduled at [facility] under general anesthesia as an outpatient.  Surgical Procedure: The patient is brought to the operating room on 11/23/2020 and placed in supine position on the operating table. General endotracheal anesthesia was established without difficulty. When the patient was adequately anesthetized, surgical timeout was performed with correct  identification of the patient and the surgical procedure. The patient's nose was then injected with 8 cc of 1% lidocaine 1:100,000 dilution epinephrine which was injected in a submucosal fashion. The patient's nose was then packed with Afrin-soaked cottonoid pledgets were left in place for approximately 10 minutes to allow for  vasoconstriction and hemostasis.  The Xomed Fusion navigation headgear was applied in anatomic and surgical landmarks were identified and confirmed, navigation was used throughout the sinus component of the surgical procedure.  With the patient prepped draped and prepared for surgery, nasal nasal endoscopy was performed on the right side.  Using a 0 degree endoscope and a straight microdebrider, an anterior ethmoidectomy was performed dissecting from anterior to posterior along the floor of the ethmoid sinus removing bony septations and diseased mucosa.  Using a 45 degree telescope and a curved microdebrider dissection was then carried out along the roof of the ethmoid sinus with navigation, completing an ethmoidectomy.  Attention was then turned to the nasal frontal recess and again using a curved microdebrider, navigation and endoscopic visualization the nasal frontal recess was widely open, underlying ethmoid cells and diseased mucosa resected creating a widely patent nasal frontal recess.  The lateral nasal wall was inspected, uncinate process was resected using a through-cutting forcep and the natural ostium of the maxillary sinus was enlarged in a posterior and inferior direction.  Within the maxillary sinus thick mucoid material and polypoid soft tissue was resected.    The patient's left side was then inspected and anterior ethmoidectomy was performed using a 0 degree telescope and straight microdebrider along the floor of the ethmoid sinus, a 45 degree telescope and curved microdebrider with navigation was used along the roof the ethmoid sinus from posterior to anterior to  complete a total ethmoidectomy.  The nasal frontal recess was then explored and underlying ethmoid disease was resected with a curved microdebrider, the nasal frontal recess was widely opened and diseased material was resected from within the sinus.  Attention was then turned to the lateral nasal wall, the natural ostium maxillary sinus was identified. The uncinate process was resected.  Diseased mucosa from within the maxilla sinus was then cleared and the ostium was enlarged in a posterior and inferior direction.    A left anterior hemitransfixion incision was created and a mucoperichondrial flap was elevated from anterior to posterior on the left-hand side. The anterior cartilaginous septum was crossed at the midline and a mucoperichondrial flap was elevated on the patient's right.  Swivel knife was then used to resect the anterior and mid cartilaginous portion of the nasal septum.  Resected cartilage was morcellized and returned to the mucoperichondrial pocket at the occlusion of the surgical procedure.  Dissection was then carried out from anterior to posterior removing deviated bone and cartilage including a large septal spur the overlying mucosa was preserved.  With the septum brought to good midline position, the morselized cartilage was returned to the mucoperichondrial pocket and the soft tissue/mucosal flaps were reapproximated with interrupted 4-0 gut suture on a Keith needle in a horizontal mattressing fashion.  Anterior hemitransfixion incision was closed with the same stitch.  Bilateral Doyle nasal septal splints were then placed after the application of Bactroban ointment and sutured in position with a 3-0 Ethilon suture.  Attention was then turned to the inferior turbinates, bilateral inferior turbinate intramural cautery was performed with cautery setting at 68 W.  2 submucosal passes were made in each inferior turbinate.  After completing cautery, anterior vertical incisions were created and  overlying soft tissue was elevated, a small amount of turbinate bone was resected.  The turbinates were then outfractured to create a more patent nasal passageway.  At the conclusion of the procedure, a 50-50 mix of Kenalog 40 and Bactroban was instilled in the sinuses.  No nasal packing was placed in the common ethmoid cavity bilaterally.  Surgical sponge count was correct. An oral gastric tube was passed and the stomach contents were aspirated. Patient was awakened from anesthetic and transferred from the operating room to the recovery room in stable condition. There were no complications and blood loss was 100 cc.   Delsa Bern, M.D. [facility] 11/23/2020

## 2020-11-24 LAB — SURGICAL PATHOLOGY

## 2020-12-14 ENCOUNTER — Other Ambulatory Visit: Payer: Self-pay | Admitting: Family Medicine

## 2021-01-06 ENCOUNTER — Other Ambulatory Visit: Payer: Self-pay | Admitting: Family Medicine

## 2021-03-18 ENCOUNTER — Other Ambulatory Visit: Payer: Self-pay | Admitting: Family Medicine

## 2021-03-20 NOTE — Telephone Encounter (Signed)
Pt needs appointment for further refills 

## 2021-04-09 ENCOUNTER — Other Ambulatory Visit: Payer: Self-pay | Admitting: Family Medicine

## 2021-04-10 NOTE — Telephone Encounter (Signed)
Pt needs appointment for further refills 

## 2021-06-14 ENCOUNTER — Other Ambulatory Visit: Payer: Self-pay | Admitting: Family Medicine

## 2021-06-19 ENCOUNTER — Other Ambulatory Visit: Payer: Self-pay | Admitting: Family Medicine

## 2021-06-26 ENCOUNTER — Ambulatory Visit (INDEPENDENT_AMBULATORY_CARE_PROVIDER_SITE_OTHER): Payer: BC Managed Care – PPO | Admitting: Family Medicine

## 2021-06-26 ENCOUNTER — Other Ambulatory Visit: Payer: Self-pay

## 2021-06-26 ENCOUNTER — Other Ambulatory Visit: Payer: BC Managed Care – PPO

## 2021-06-26 ENCOUNTER — Encounter: Payer: Self-pay | Admitting: Family Medicine

## 2021-06-26 VITALS — BP 120/82 | HR 64 | Temp 99.2°F | Ht >= 80 in | Wt 350.0 lb

## 2021-06-26 DIAGNOSIS — M1A9XX0 Chronic gout, unspecified, without tophus (tophi): Secondary | ICD-10-CM | POA: Diagnosis not present

## 2021-06-26 DIAGNOSIS — Z0000 Encounter for general adult medical examination without abnormal findings: Secondary | ICD-10-CM

## 2021-06-26 DIAGNOSIS — E039 Hypothyroidism, unspecified: Secondary | ICD-10-CM

## 2021-06-26 DIAGNOSIS — M159 Polyosteoarthritis, unspecified: Secondary | ICD-10-CM

## 2021-06-26 DIAGNOSIS — M150 Primary generalized (osteo)arthritis: Secondary | ICD-10-CM

## 2021-06-26 LAB — HEPATIC FUNCTION PANEL
ALT: 40 U/L (ref 0–53)
AST: 38 U/L — ABNORMAL HIGH (ref 0–37)
Albumin: 4.4 g/dL (ref 3.5–5.2)
Alkaline Phosphatase: 62 U/L (ref 39–117)
Bilirubin, Direct: 0.2 mg/dL (ref 0.0–0.3)
Total Bilirubin: 1.1 mg/dL (ref 0.2–1.2)
Total Protein: 7.5 g/dL (ref 6.0–8.3)

## 2021-06-26 LAB — URIC ACID: Uric Acid, Serum: 8 mg/dL — ABNORMAL HIGH (ref 4.0–7.8)

## 2021-06-26 LAB — TSH: TSH: 1.06 u[IU]/mL (ref 0.35–5.50)

## 2021-06-26 LAB — BASIC METABOLIC PANEL
BUN: 14 mg/dL (ref 6–23)
CO2: 29 mEq/L (ref 19–32)
Calcium: 9.4 mg/dL (ref 8.4–10.5)
Chloride: 101 mEq/L (ref 96–112)
Creatinine, Ser: 1.28 mg/dL (ref 0.40–1.50)
GFR: 64.29 mL/min (ref >60.00)
Glucose, Bld: 101 mg/dL — ABNORMAL HIGH (ref 70–99)
Potassium: 4.7 mEq/L (ref 3.5–5.1)
Sodium: 140 mEq/L (ref 135–145)

## 2021-06-26 LAB — LIPID PANEL
Cholesterol: 176 mg/dL (ref 0–200)
HDL: 36 mg/dL — ABNORMAL LOW (ref >39.00)
LDL Cholesterol: 100 mg/dL — ABNORMAL HIGH (ref 0–99)
NonHDL: 139.74
Total CHOL/HDL Ratio: 5
Triglycerides: 198 mg/dL — ABNORMAL HIGH (ref 0.0–149.0)
VLDL: 39.6 mg/dL (ref 0.0–40.0)

## 2021-06-26 LAB — HEMOGLOBIN A1C: Hgb A1c MFr Bld: 6.8 % — ABNORMAL HIGH (ref 4.6–6.5)

## 2021-06-26 LAB — T4, FREE: Free T4: 1.11 ng/dL (ref 0.60–1.60)

## 2021-06-26 LAB — PSA: PSA: 0.36 ng/mL (ref 0.10–4.00)

## 2021-06-26 LAB — T3, FREE: T3, Free: 3.1 pg/mL (ref 2.3–4.2)

## 2021-06-26 MED ORDER — LEVOTHYROXINE SODIUM 200 MCG PO TABS: ORAL_TABLET | ORAL | 3 refills | Status: DC

## 2021-06-26 MED ORDER — ALBUTEROL SULFATE HFA 108 (90 BASE) MCG/ACT IN AERS: INHALATION_SPRAY | RESPIRATORY_TRACT | 5 refills | Status: AC

## 2021-06-26 MED ORDER — EZETIMIBE 10 MG PO TABS: 10.0000 mg | ORAL_TABLET | Freq: Every day | ORAL | 3 refills | Status: DC

## 2021-06-26 MED ORDER — OMEPRAZOLE 40 MG PO CPDR: DELAYED_RELEASE_CAPSULE | ORAL | 3 refills | Status: AC

## 2021-06-26 MED ORDER — METOPROLOL SUCCINATE ER 100 MG PO TB24: 100.0000 mg | ORAL_TABLET | Freq: Every day | ORAL | 3 refills | Status: DC

## 2021-06-26 MED ORDER — COLCHICINE 0.6 MG PO TABS: 0.6000 mg | ORAL_TABLET | ORAL | 5 refills | Status: DC | PRN

## 2021-06-26 MED ORDER — ESCITALOPRAM OXALATE 20 MG PO TABS: 20.0000 mg | ORAL_TABLET | Freq: Every day | ORAL | 3 refills | Status: DC

## 2021-06-26 MED ORDER — MELOXICAM 15 MG PO TABS: 15.0000 mg | ORAL_TABLET | Freq: Every day | ORAL | 3 refills | Status: DC

## 2021-06-26 NOTE — Progress Notes (Signed)
Subjective:    Patient ID: Matthew Reese, male    DOB: 26-Jun-1971, 50 y.o.   MRN: [id number]  HPI Here for a well exam. He feels well except for stiffness and pain in many joints, especially shoulders, elbows, hips, and knees.    Review of Systems  Constitutional: Negative.   HENT: Negative.    Eyes: Negative.   Respiratory: Negative.    Cardiovascular: Negative.   Gastrointestinal: Negative.   Genitourinary: Negative.   Musculoskeletal:  Positive for arthralgias.  Skin: Negative.   Neurological: Negative.   Psychiatric/Behavioral: Negative.        Objective:   Physical Exam Constitutional:      General: He is not in acute distress.    Appearance: Normal appearance. He is well-developed. He is not diaphoretic.  HENT:     Head: Normocephalic and atraumatic.     Right Ear: External ear normal.     Left Ear: External ear normal.     Nose: Nose normal.     Mouth/Throat:     Pharynx: No oropharyngeal exudate.  Eyes:     General: No scleral icterus.       Right eye: No discharge.        Left eye: No discharge.     Conjunctiva/sclera: Conjunctivae normal.     Pupils: Pupils are equal, round, and reactive to light.  Neck:     Thyroid: No thyromegaly.     Vascular: No JVD.     Trachea: No tracheal deviation.  Cardiovascular:     Rate and Rhythm: Normal rate and regular rhythm.     Heart sounds: Normal heart sounds. No murmur heard.   No friction rub. No gallop.  Pulmonary:     Effort: Pulmonary effort is normal. No respiratory distress.     Breath sounds: Normal breath sounds. No wheezing or rales.  Chest:     Chest wall: No tenderness.  Abdominal:     General: Bowel sounds are normal. There is no distension.     Palpations: Abdomen is soft. There is no mass.     Tenderness: There is no abdominal tenderness. There is no guarding or rebound.  Genitourinary:    Penis: Normal. No tenderness.      Testes: Normal.     Prostate: Normal.     Rectum: Normal. Guaiac  result negative.  Musculoskeletal:        General: No tenderness. Normal range of motion.     Cervical back: Neck supple.  Lymphadenopathy:     Cervical: No cervical adenopathy.  Skin:    General: Skin is warm and dry.     Coloration: Skin is not pale.     Findings: No erythema or rash.  Neurological:     Mental Status: He is alert and oriented to person, place, and time.     Cranial Nerves: No cranial nerve deficit.     Motor: No abnormal muscle tone.     Coordination: Coordination normal.     Deep Tendon Reflexes: Reflexes are normal and symmetric. Reflexes normal.  Psychiatric:        Behavior: Behavior normal.        Thought Content: Thought content normal.        Judgment: Judgment normal.          Assessment & Plan:  Well exam. We discussed diet and exercise. Get fasting labs. Set up a colonoscopy soon. He has some osteoarthritis, so he will try Meloxicam 15 mg daily.  Alysia Penna, MD

## 2021-06-27 ENCOUNTER — Inpatient Hospital Stay: Payer: BC Managed Care – PPO | Admitting: Hematology and Oncology

## 2021-06-27 ENCOUNTER — Telehealth: Payer: Self-pay | Admitting: Family Medicine

## 2021-06-27 ENCOUNTER — Inpatient Hospital Stay: Payer: BC Managed Care – PPO | Attending: Hematology and Oncology

## 2021-06-27 ENCOUNTER — Telehealth: Payer: Self-pay | Admitting: Hematology and Oncology

## 2021-06-27 ENCOUNTER — Other Ambulatory Visit: Payer: Self-pay | Admitting: Hematology and Oncology

## 2021-06-27 VITALS — BP 132/78 | HR 59 | Temp 97.7°F | Resp 18 | Ht >= 80 in | Wt 353.9 lb

## 2021-06-27 DIAGNOSIS — E785 Hyperlipidemia, unspecified: Secondary | ICD-10-CM | POA: Insufficient documentation

## 2021-06-27 DIAGNOSIS — Z803 Family history of malignant neoplasm of breast: Secondary | ICD-10-CM | POA: Diagnosis not present

## 2021-06-27 DIAGNOSIS — I10 Essential (primary) hypertension: Secondary | ICD-10-CM | POA: Insufficient documentation

## 2021-06-27 DIAGNOSIS — G4733 Obstructive sleep apnea (adult) (pediatric): Secondary | ICD-10-CM | POA: Diagnosis not present

## 2021-06-27 DIAGNOSIS — D696 Thrombocytopenia, unspecified: Secondary | ICD-10-CM

## 2021-06-27 DIAGNOSIS — Z8616 Personal history of COVID-19: Secondary | ICD-10-CM | POA: Insufficient documentation

## 2021-06-27 DIAGNOSIS — E039 Hypothyroidism, unspecified: Secondary | ICD-10-CM | POA: Diagnosis not present

## 2021-06-27 DIAGNOSIS — Z801 Family history of malignant neoplasm of trachea, bronchus and lung: Secondary | ICD-10-CM | POA: Insufficient documentation

## 2021-06-27 DIAGNOSIS — Z87891 Personal history of nicotine dependence: Secondary | ICD-10-CM | POA: Insufficient documentation

## 2021-06-27 DIAGNOSIS — K760 Fatty (change of) liver, not elsewhere classified: Secondary | ICD-10-CM | POA: Insufficient documentation

## 2021-06-27 LAB — CMP (CANCER CENTER ONLY)
ALT: 42 U/L (ref 0–44)
AST: 43 U/L — ABNORMAL HIGH (ref 15–41)
Albumin: 4 g/dL (ref 3.5–5.0)
Alkaline Phosphatase: 66 U/L (ref 38–126)
Anion gap: 11 (ref 5–15)
BUN: 13 mg/dL (ref 6–20)
CO2: 25 mmol/L (ref 22–32)
Calcium: 9.2 mg/dL (ref 8.9–10.3)
Chloride: 102 mmol/L (ref 98–111)
Creatinine: 1.33 mg/dL — ABNORMAL HIGH (ref 0.61–1.24)
GFR, Estimated: >60 mL/min (ref >60)
Glucose, Bld: 116 mg/dL — ABNORMAL HIGH (ref 70–99)
Potassium: 4.1 mmol/L (ref 3.5–5.1)
Sodium: 138 mmol/L (ref 135–145)
Total Bilirubin: 1 mg/dL (ref 0.3–1.2)
Total Protein: 8 g/dL (ref 6.5–8.1)

## 2021-06-27 LAB — CBC WITH DIFFERENTIAL (CANCER CENTER ONLY)
Abs Immature Granulocytes: 0.02 10*3/uL (ref 0.00–0.07)
Basophils Absolute: 0.1 10*3/uL (ref 0.0–0.1)
Basophils Relative: 1 %
Eosinophils Absolute: 0.3 10*3/uL (ref 0.0–0.5)
Eosinophils Relative: 3 %
HCT: 45.2 % (ref 39.0–52.0)
Hemoglobin: 15 g/dL (ref 13.0–17.0)
Immature Granulocytes: 0 %
Lymphocytes Relative: 37 %
Lymphs Abs: 3.3 10*3/uL (ref 0.7–4.0)
MCH: 29.6 pg (ref 26.0–34.0)
MCHC: 33.2 g/dL (ref 30.0–36.0)
MCV: 89.3 fL (ref 80.0–100.0)
Monocytes Absolute: 0.7 10*3/uL (ref 0.1–1.0)
Monocytes Relative: 8 %
Neutro Abs: 4.4 10*3/uL (ref 1.7–7.7)
Neutrophils Relative %: 51 %
Platelet Count: UNDETERMINED 10*3/uL (ref 150–400)
RBC: 5.06 MIL/uL (ref 4.22–5.81)
RDW: 14.6 % (ref 11.5–15.5)
Smear Review: UNDETERMINED
WBC Count: 8.9 10*3/uL (ref 4.0–10.5)
nRBC: 0 % (ref 0.0–0.2)

## 2021-06-27 LAB — CBC WITH DIFFERENTIAL/PLATELET
Absolute Monocytes: 689 cells/uL (ref 200–950)
Basophils Absolute: 81 cells/uL (ref 0–200)
Basophils Relative: 1 %
Eosinophils Absolute: 308 cells/uL (ref 15–500)
Eosinophils Relative: 3.8 %
HCT: 47.5 % (ref 38.5–50.0)
Hemoglobin: 15.7 g/dL (ref 13.2–17.1)
Lymphs Abs: 2705 cells/uL (ref 850–3900)
MCH: 29.8 pg (ref 27.0–33.0)
MCHC: 33.1 g/dL (ref 32.0–36.0)
MCV: 90.3 fL (ref 80.0–100.0)
MPV: 10.6 fL (ref 7.5–12.5)
Monocytes Relative: 8.5 %
Neutro Abs: 4317 cells/uL (ref 1500–7800)
Neutrophils Relative %: 53.3 %
Platelets: 9 10*3/uL — CL (ref 140–400)
RBC: 5.26 10*6/uL (ref 4.20–5.80)
RDW: 14.1 % (ref 11.0–15.0)
Total Lymphocyte: 33.4 %
WBC: 8.1 10*3/uL (ref 3.8–10.8)

## 2021-06-27 LAB — SAVE SMEAR(SSMR), FOR PROVIDER SLIDE REVIEW

## 2021-06-27 LAB — LACTATE DEHYDROGENASE: LDH: 173 U/L (ref 98–192)

## 2021-06-27 LAB — IMMATURE PLATELET FRACTION: Immature Platelet Fraction: 43.6 % — ABNORMAL HIGH (ref 1.2–8.6)
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
## 2021-06-27 NOTE — Telephone Encounter (Signed)
Sch per 11/15 MD request,  pt aware and coming to appt.

## 2021-06-27 NOTE — Telephone Encounter (Signed)
I no longer get routine UA's with physicals

## 2021-06-27 NOTE — Telephone Encounter (Signed)
Pt is calling and would like dr fry to return his call concerning hematologist

## 2021-06-28 ENCOUNTER — Encounter: Payer: Self-pay | Admitting: Family Medicine

## 2021-06-28 NOTE — Telephone Encounter (Signed)
Please advise 

## 2021-06-29 NOTE — Telephone Encounter (Signed)
I asked our office manager to call him and address this situation

## 2021-06-29 NOTE — Telephone Encounter (Signed)
I have asked our office manager to speak to him about this situation

## 2021-07-27 ENCOUNTER — Encounter: Payer: Self-pay | Admitting: Internal Medicine

## 2021-07-30 ENCOUNTER — Encounter: Payer: Self-pay | Admitting: Internal Medicine

## 2021-10-08 ENCOUNTER — Encounter: Payer: Self-pay | Admitting: Family Medicine

## 2021-11-27 ENCOUNTER — Ambulatory Visit (INDEPENDENT_AMBULATORY_CARE_PROVIDER_SITE_OTHER): Payer: 59

## 2021-11-27 ENCOUNTER — Ambulatory Visit (INDEPENDENT_AMBULATORY_CARE_PROVIDER_SITE_OTHER): Payer: 59 | Admitting: Orthopaedic Surgery

## 2021-11-27 ENCOUNTER — Encounter: Payer: Self-pay | Admitting: Orthopaedic Surgery

## 2021-11-27 DIAGNOSIS — M5442 Lumbago with sciatica, left side: Secondary | ICD-10-CM

## 2021-11-27 DIAGNOSIS — G8929 Other chronic pain: Secondary | ICD-10-CM | POA: Diagnosis not present

## 2021-11-27 DIAGNOSIS — Z96642 Presence of left artificial hip joint: Secondary | ICD-10-CM

## 2021-11-27 MED ORDER — PREDNISONE 50 MG PO TABS: ORAL_TABLET | ORAL | 0 refills | Status: DC

## 2021-11-27 MED ORDER — METHOCARBAMOL 750 MG PO TABS: 750.0000 mg | ORAL_TABLET | Freq: Three times a day (TID) | ORAL | 1 refills | Status: DC | PRN
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
## 2021-11-28 ENCOUNTER — Other Ambulatory Visit: Payer: Self-pay

## 2021-11-28 DIAGNOSIS — G8929 Other chronic pain: Secondary | ICD-10-CM

## 2021-12-10 ENCOUNTER — Ambulatory Visit
Admission: RE | Admit: 2021-12-10 | Discharge: 2021-12-10 | Disposition: A | Discharge status: Home / Self Care | Payer: 59 | Source: Ambulatory Visit | Attending: Orthopaedic Surgery | Admitting: Orthopaedic Surgery

## 2021-12-10 DIAGNOSIS — G8929 Other chronic pain: Secondary | ICD-10-CM

## 2021-12-13 ENCOUNTER — Encounter: Payer: Self-pay | Admitting: Orthopaedic Surgery

## 2021-12-13 ENCOUNTER — Ambulatory Visit: Payer: 59 | Admitting: Orthopaedic Surgery

## 2021-12-13 DIAGNOSIS — M25562 Pain in left knee: Secondary | ICD-10-CM

## 2021-12-13 DIAGNOSIS — M5442 Lumbago with sciatica, left side: Secondary | ICD-10-CM

## 2021-12-13 DIAGNOSIS — Z96642 Presence of left artificial hip joint: Secondary | ICD-10-CM

## 2021-12-13 DIAGNOSIS — G8929 Other chronic pain: Secondary | ICD-10-CM | POA: Diagnosis not present

## 2021-12-13 MED ORDER — LIDOCAINE HCL 1 % IJ SOLN
3.0000 mL | INTRAMUSCULAR | Status: AC | PRN
  Administered 2021-12-13: 3 mL

## 2021-12-13 MED ORDER — METHYLPREDNISOLONE ACETATE 40 MG/ML IJ SUSP
40.0000 mg | INTRAMUSCULAR | Status: AC | PRN
  Administered 2021-12-13: 40 mg via INTRA_ARTICULAR
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
## 2021-12-14 ENCOUNTER — Other Ambulatory Visit: Payer: Self-pay

## 2021-12-14 DIAGNOSIS — Z96642 Presence of left artificial hip joint: Secondary | ICD-10-CM

## 2021-12-22 ENCOUNTER — Telehealth: Payer: Self-pay | Admitting: Physical Medicine and Rehabilitation

## 2021-12-22 NOTE — Telephone Encounter (Signed)
Patient calling back about scheduling. Can you please call to schedule? ?

## 2021-12-22 NOTE — Telephone Encounter (Signed)
Pt called to set a referral appt sent in. Please call pt at [phone]. ?

## 2022-01-11 ENCOUNTER — Ambulatory Visit: Payer: Self-pay

## 2022-01-11 ENCOUNTER — Ambulatory Visit (INDEPENDENT_AMBULATORY_CARE_PROVIDER_SITE_OTHER): Payer: 59 | Admitting: Physical Medicine and Rehabilitation

## 2022-01-11 DIAGNOSIS — M25552 Pain in left hip: Secondary | ICD-10-CM

## 2022-01-11 NOTE — Progress Notes (Signed)
Numeric Pain Rating Scale and Functional Assessment Average Pain 8   In the last MONTH (on 0-10 scale) has pain interfered with the following?  1. General activity like being  able to carry out your everyday physical activities such as walking, climbing stairs, carrying groceries, or moving a chair?  Rating(8)   -Driver, -BT, -Dye Allergies.   He only has pain when he lifts leg up

## 2022-01-11 NOTE — Progress Notes (Signed)
   Matthew Reese - 51 y.o. male MRN [id number]  Date of birth: 09-17-1970  Office Visit Note: Visit Date: 01/11/2022 PCP: Laurey Morale, MD Referred by: Mcarthur Rossetti*  Subjective: Chief Complaint  Patient presents with   Left Hip - Pain   HPI:  Matthew Reese is a 51 y.o. male who comes in today at the request of Dr. Jean Rosenthal for planned Left iliopsoas injection with fluoroscopic guidance.  The patient has failed conservative care including home exercise, medications, time and activity modification.  This injection will be diagnostic and hopefully therapeutic.  Please see requesting physician notes for further details and justification.   ROS Otherwise per HPI.  Assessment & Plan: Visit Diagnoses:    ICD-10-CM   1. Pain in left hip  M25.552 XR C-ARM NO REPORT    Large Joint Inj, Illiopsoas: L hip joint      Plan: No additional findings.   Meds & Orders: No orders of the defined types were placed in this encounter.   Orders Placed This Encounter  Procedures   Large Joint Inj, Illiopsoas: L hip joint   XR C-ARM NO REPORT    Follow-up: Return if symptoms worsen or fail to improve.   Procedures: Large Joint Inj, Illiopsoas: L hip joint on 01/11/2022 4:02 PM Indications: pain and diagnostic evaluation Details: 22 G 3.5 in needle, fluoroscopy-guided anterior approach  Arthrogram: No  Medications: 5 mL bupivacaine 0.25 %; 60 mg triamcinolone acetonide 40 MG/ML Outcome: tolerated well, no immediate complications  Excellent flow of contrast outlining iliopsoas muscle. Consent was given by the patient. Immediately prior to procedure a time out was called to verify the correct patient, procedure, equipment, support staff and site/side marked as required. Patient was prepped and draped in the usual sterile fashion.          Clinical History: No specialty comments available.     Objective:  VS:  HT:    WT:   BMI:     BP:   HR: bpm  TEMP: ( )   RESP:  Physical Exam   Imaging: No results found.

## 2022-01-24 MED ORDER — BUPIVACAINE HCL 0.25 % IJ SOLN
5.0000 mL | INTRAMUSCULAR | Status: AC | PRN
  Administered 2022-01-11: 5 mL via INTRA_ARTICULAR

## 2022-01-24 MED ORDER — TRIAMCINOLONE ACETONIDE 40 MG/ML IJ SUSP
60.0000 mg | INTRAMUSCULAR | Status: AC | PRN
  Administered 2022-01-11: 60 mg via INTRA_ARTICULAR

## 2022-03-21 ENCOUNTER — Ambulatory Visit (INDEPENDENT_AMBULATORY_CARE_PROVIDER_SITE_OTHER): Payer: 59 | Admitting: Physician Assistant

## 2022-03-21 ENCOUNTER — Encounter: Payer: Self-pay | Admitting: Physician Assistant

## 2022-03-21 DIAGNOSIS — M5442 Lumbago with sciatica, left side: Secondary | ICD-10-CM

## 2022-03-21 DIAGNOSIS — M25562 Pain in left knee: Secondary | ICD-10-CM

## 2022-03-21 DIAGNOSIS — M7061 Trochanteric bursitis, right hip: Secondary | ICD-10-CM | POA: Diagnosis not present

## 2022-03-21 DIAGNOSIS — G8929 Other chronic pain: Secondary | ICD-10-CM

## 2022-03-21 DIAGNOSIS — M7062 Trochanteric bursitis, left hip: Secondary | ICD-10-CM | POA: Diagnosis not present

## 2022-03-21 MED ORDER — METHYLPREDNISOLONE ACETATE 40 MG/ML IJ SUSP
40.0000 mg | INTRAMUSCULAR | Status: AC | PRN
  Administered 2022-03-21: 40 mg via INTRA_ARTICULAR

## 2022-03-21 MED ORDER — LIDOCAINE HCL 1 % IJ SOLN
4.0000 mL | INTRAMUSCULAR | Status: AC | PRN
  Administered 2022-03-21: 4 mL
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
## 2022-03-22 NOTE — Addendum Note (Signed)
Addended by: Robyne Peers on: 03/22/2022 08:12 AM   Modules accepted: Orders

## 2022-04-04 ENCOUNTER — Ambulatory Visit: Payer: Self-pay

## 2022-04-04 ENCOUNTER — Ambulatory Visit (INDEPENDENT_AMBULATORY_CARE_PROVIDER_SITE_OTHER): Payer: 59 | Admitting: Physical Medicine and Rehabilitation

## 2022-04-04 ENCOUNTER — Encounter: Payer: Self-pay | Admitting: Physical Medicine and Rehabilitation

## 2022-04-04 VITALS — BP 154/100 | HR 58

## 2022-04-04 DIAGNOSIS — M5416 Radiculopathy, lumbar region: Secondary | ICD-10-CM

## 2022-04-04 MED ORDER — METHYLPREDNISOLONE ACETATE 80 MG/ML IJ SUSP
40.0000 mg | Freq: Once | INTRAMUSCULAR | Status: AC
  Administered 2022-04-04: 40 mg

## 2022-04-04 NOTE — Patient Instructions (Signed)

## 2022-04-04 NOTE — Progress Notes (Signed)
Pt state lower back pain that travels down his left leg and right hip. Pt state getting up from a sitting position makes the pain worse. Pt state he takes over the counter pain med to help ease his pain.  Numeric Pain Rating Scale and Functional Assessment Average Pain 9   In the last MONTH (on 0-10 scale) has pain interfered with the following?  1. General activity like being  able to carry out your everyday physical activities such as walking, climbing stairs, carrying groceries, or moving a chair?  Rating(10)   +Driver, -BT, -Dye Allergies.

## 2022-04-12 NOTE — Procedures (Signed)
Lumbosacral Transforaminal Epidural Steroid Injection - Sub-Pedicular Approach with Fluoroscopic Guidance  Patient: Matthew Reese      Date of Birth: 1971/01/31 MRN: [id number] PCP: Laurey Morale, MD      Visit Date: 04/04/2022   Universal Protocol:    Date/Time: 04/04/2022  Consent Given By: the patient  Position: PRONE  Additional Comments: Vital signs were monitored before and after the procedure. Patient was prepped and draped in the usual sterile fashion. The correct patient, procedure, and site was verified.   Injection Procedure Details:   Procedure diagnoses: Lumbar radiculopathy [M54.16]    Meds Administered:  Meds ordered this encounter  Medications   methylPREDNISolone acetate (DEPO-MEDROL) injection 40 mg    Laterality: Left  Location/Site: L4  Needle:5.0 in., 22 ga.  Short bevel or Quincke spinal needle  Needle Placement: Transforaminal  Findings:    -Comments: Excellent flow of contrast along the nerve, nerve root and into the epidural space.  Procedure Details: After squaring off the end-plates to get a true AP view, the C-arm was positioned so that an oblique view of the foramen as noted above was visualized. The target area is just inferior to the "nose of the scotty dog" or sub pedicular. The soft tissues overlying this structure were infiltrated with 2-3 ml. of 1% Lidocaine without Epinephrine.  The spinal needle was inserted toward the target using a "trajectory" view along the fluoroscope beam.  Under AP and lateral visualization, the needle was advanced so it did not puncture dura and was located close the 6 O'Clock position of the pedical in AP tracterory. Biplanar projections were used to confirm position. Aspiration was confirmed to be negative for CSF and/or blood. A 1-2 ml. volume of Isovue-250 was injected and flow of contrast was noted at each level. Radiographs were obtained for documentation purposes.   After attaining the desired flow  of contrast documented above, a 0.5 to 1.0 ml test dose of 0.25% Marcaine was injected into each respective transforaminal space.  The patient was observed for 90 seconds post injection.  After no sensory deficits were reported, and normal lower extremity motor function was noted,   the above injectate was administered so that equal amounts of the injectate were placed at each foramen (level) into the transforaminal epidural space.   Additional Comments:  The patient tolerated the procedure well Dressing: 2 x 2 sterile gauze and Band-Aid    Post-procedure details: Patient was observed during the procedure. Post-procedure instructions were reviewed.  Patient left the clinic in stable condition.

## 2022-04-12 NOTE — Progress Notes (Signed)
FARID GRIGORIAN - 51 y.o. male MRN [id number]  Date of birth: [birth year]  Office Visit Note: Visit Date: 04/04/2022 PCP: Laurey Morale, MD Referred by: Laurey Morale, MD  Subjective: Chief Complaint  Patient presents with   Lower Back - Pain   Left Leg - Pain   Right Hip - Pain   HPI:  HOUA ACKERT is a 51 y.o. male who comes in today at the request of Benita Stabile, PA-C for planned Left L4-5 Lumbar Transforaminal epidural steroid injection with fluoroscopic guidance.  The patient has failed conservative care including home exercise, medications, time and activity modification.  This injection will be diagnostic and hopefully therapeutic.  Please see requesting physician notes for further details and justification.   ROS Otherwise per HPI.  Assessment & Plan: Visit Diagnoses:    ICD-10-CM   1. Lumbar radiculopathy  M54.16 XR C-ARM NO REPORT    Epidural Steroid injection    methylPREDNISolone acetate (DEPO-MEDROL) injection 40 mg      Plan: No additional findings.   Meds & Orders:  Meds ordered this encounter  Medications   methylPREDNISolone acetate (DEPO-MEDROL) injection 40 mg    Orders Placed This Encounter  Procedures   XR C-ARM NO REPORT   Epidural Steroid injection    Follow-up: Return for visit to requesting provider as needed.   Procedures: No procedures performed  Lumbosacral Transforaminal Epidural Steroid Injection - Sub-Pedicular Approach with Fluoroscopic Guidance  Patient: TRUETT MCFARLAN      Date of Birth: 1971-04-25 MRN: [id number] PCP: Laurey Morale, MD      Visit Date: 04/04/2022   Universal Protocol:    Date/Time: 04/04/2022  Consent Given By: the patient  Position: PRONE  Additional Comments: Vital signs were monitored before and after the procedure. Patient was prepped and draped in the usual sterile fashion. The correct patient, procedure, and site was verified.   Injection Procedure Details:   Procedure diagnoses: Lumbar  radiculopathy [M54.16]    Meds Administered:  Meds ordered this encounter  Medications   methylPREDNISolone acetate (DEPO-MEDROL) injection 40 mg    Laterality: Left  Location/Site: L4  Needle:5.0 in., 22 ga.  Short bevel or Quincke spinal needle  Needle Placement: Transforaminal  Findings:    -Comments: Excellent flow of contrast along the nerve, nerve root and into the epidural space.  Procedure Details: After squaring off the end-plates to get a true AP view, the C-arm was positioned so that an oblique view of the foramen as noted above was visualized. The target area is just inferior to the "nose of the scotty dog" or sub pedicular. The soft tissues overlying this structure were infiltrated with 2-3 ml. of 1% Lidocaine without Epinephrine.  The spinal needle was inserted toward the target using a "trajectory" view along the fluoroscope beam.  Under AP and lateral visualization, the needle was advanced so it did not puncture dura and was located close the 6 O'Clock position of the pedical in AP tracterory. Biplanar projections were used to confirm position. Aspiration was confirmed to be negative for CSF and/or blood. A 1-2 ml. volume of Isovue-250 was injected and flow of contrast was noted at each level. Radiographs were obtained for documentation purposes.   After attaining the desired flow of contrast documented above, a 0.5 to 1.0 ml test dose of 0.25% Marcaine was injected into each respective transforaminal space.  The patient was observed for 90 seconds post injection.  After no sensory deficits were reported, and  normal lower extremity motor function was noted,   the above injectate was administered so that equal amounts of the injectate were placed at each foramen (level) into the transforaminal epidural space.   Additional Comments:  The patient tolerated the procedure well Dressing: 2 x 2 sterile gauze and Band-Aid    Post-procedure details: Patient was observed  during the procedure. Post-procedure instructions were reviewed.  Patient left the clinic in stable condition.    Clinical History: MRI LUMBAR SPINE WITHOUT CONTRAST   TECHNIQUE: Multiplanar, multisequence MR imaging of the lumbar spine was performed. No intravenous contrast was administered.   COMPARISON:  Lumbar spine radiographs 11/27/2021   FINDINGS: Segmentation: Standard; the lowest formed disc space is designated L5-S1.   Alignment: There is grade 1 anterolisthesis of L4 on L5. Alignment is otherwise normal.   Vertebrae: Vertebral body heights are preserved. Marrow signal is normal. There is no suspicious signal abnormality or marrow edema.   Conus medullaris and cauda equina: Conus extends to the L1 level. Conus and cauda equina appear normal.   Paraspinal and other soft tissues: There is mild perifacetal soft tissue edema bilaterally at L4-L5. The paraspinal soft tissues are otherwise unremarkable.   Disc levels:   The disc heights are overall preserved   T12-L1: No significant spinal canal or neural foraminal stenosis.   L1-L2: No significant spinal canal or neural foraminal stenosis   L2-L3: No significant spinal canal or neural foraminal stenosis   L3-L4: There is a mild disc bulge eccentric to the left resulting in mild bilateral neural foraminal stenosis with contact of the exiting left L3 nerve root (5-18), and no significant spinal canal stenosis.   L4-L5: There is grade 1 anterolisthesis with a mild disc bulge and moderate bilateral facet arthropathy resulting in narrowing of the subarticular zones, right more than left, with possible irritation of the traversing right L5 nerve root, and mild bilateral neural foraminal stenosis with possible contact of the exiting left L4 nerve root (5-18).   L5-S1: Mild bilateral facet arthropathy without significant spinal canal or neural foraminal stenosis.   IMPRESSION: 1. Grade 1 anterolisthesis of L4 on  L5 with a mild disc bulge and moderate bilateral facet arthropathy resulting in right worse than left subarticular zone narrowing with possible irritation of the traversing right L5 nerve root, and mild bilateral neural foraminal stenosis with possible contact of the exiting left L4 nerve root by disc material. There is also mild bilateral perifacetal soft tissue edema at this level, which could reflect a source of pain. 2. Mild disc bulge eccentric to the left at L3-L4 with possible contact of the exiting left L3 nerve root.     Electronically Signed   By: Valetta Mole M.D.   On: 12/11/2021 13:45     Objective:  VS:  HT:    WT:   BMI:     BP:(!) 154/100  HR:(!) 58bpm  TEMP: ( )  RESP:  Physical Exam Vitals and nursing note reviewed.  Constitutional:      General: He is not in acute distress.    Appearance: Normal appearance. He is not ill-appearing.  HENT:     Head: Normocephalic and atraumatic.     Right Ear: External ear normal.     Left Ear: External ear normal.     Nose: No congestion.  Eyes:     Extraocular Movements: Extraocular movements intact.  Cardiovascular:     Rate and Rhythm: Normal rate.     Pulses: Normal pulses.  Pulmonary:     Effort: Pulmonary effort is normal. No respiratory distress.  Abdominal:     General: There is no distension.     Palpations: Abdomen is soft.  Musculoskeletal:        General: No tenderness or signs of injury.     Cervical back: Neck supple.     Right lower leg: No edema.     Left lower leg: No edema.     Comments: Patient has good distal strength without clonus.  Skin:    Findings: No erythema or rash.  Neurological:     General: No focal deficit present.     Mental Status: He is alert and oriented to person, place, and time.     Sensory: No sensory deficit.     Motor: No weakness or abnormal muscle tone.     Coordination: Coordination normal.  Psychiatric:        Mood and Affect: Mood normal.        Behavior:  Behavior normal.      Imaging: No results found.

## 2022-05-22 ENCOUNTER — Other Ambulatory Visit: Payer: Self-pay | Admitting: Family Medicine

## 2022-06-27 ENCOUNTER — Encounter: Payer: Self-pay | Admitting: Family Medicine

## 2022-06-27 ENCOUNTER — Ambulatory Visit (INDEPENDENT_AMBULATORY_CARE_PROVIDER_SITE_OTHER): Payer: 59 | Admitting: Family Medicine

## 2022-06-27 VITALS — BP 120/80 | HR 60 | Temp 98.4°F | Ht >= 80 in | Wt 330.2 lb

## 2022-06-27 DIAGNOSIS — E039 Hypothyroidism, unspecified: Secondary | ICD-10-CM | POA: Diagnosis not present

## 2022-06-27 DIAGNOSIS — Z0000 Encounter for general adult medical examination without abnormal findings: Secondary | ICD-10-CM | POA: Diagnosis not present

## 2022-06-27 LAB — BASIC METABOLIC PANEL
BUN: 18 mg/dL (ref 6–23)
CO2: 32 mEq/L (ref 19–32)
Calcium: 9.5 mg/dL (ref 8.4–10.5)
Chloride: 100 mEq/L (ref 96–112)
Creatinine, Ser: 1.35 mg/dL (ref 0.40–1.50)
GFR: 59.88 mL/min — ABNORMAL LOW (ref >60.00)
Glucose, Bld: 101 mg/dL — ABNORMAL HIGH (ref 70–99)
Potassium: 4.5 mEq/L (ref 3.5–5.1)
Sodium: 139 mEq/L (ref 135–145)

## 2022-06-27 LAB — CBC WITH DIFFERENTIAL/PLATELET
Basophils Absolute: 0.1 10*3/uL (ref 0.0–0.1)
Basophils Relative: 1.2 % (ref 0.0–3.0)
Eosinophils Absolute: 0.3 10*3/uL (ref 0.0–0.7)
Eosinophils Relative: 2.8 % (ref 0.0–5.0)
HCT: 48.5 % (ref 39.0–52.0)
Hemoglobin: 16.5 g/dL (ref 13.0–17.0)
Lymphocytes Relative: 33.5 % (ref 12.0–46.0)
Lymphs Abs: 4 10*3/uL (ref 0.7–4.0)
MCHC: 33.9 g/dL (ref 30.0–36.0)
MCV: 93.3 fl (ref 78.0–100.0)
Monocytes Absolute: 1.1 10*3/uL — ABNORMAL HIGH (ref 0.1–1.0)
Monocytes Relative: 9.5 % (ref 3.0–12.0)
Neutro Abs: 6.4 10*3/uL (ref 1.4–7.7)
Neutrophils Relative %: 53 % (ref 43.0–77.0)
Platelets: 276 10*3/uL (ref 150.0–400.0)
RBC: 5.2 Mil/uL (ref 4.22–5.81)
RDW: 14.3 % (ref 11.5–15.5)
WBC: 8.9 10*3/uL (ref 4.0–10.5)

## 2022-06-27 LAB — LIPID PANEL
Cholesterol: 153 mg/dL (ref 0–200)
HDL: 37.1 mg/dL — ABNORMAL LOW (ref >39.00)
LDL Cholesterol: 79 mg/dL (ref 0–99)
NonHDL: 116
Total CHOL/HDL Ratio: 4
Triglycerides: 185 mg/dL — ABNORMAL HIGH (ref 0.0–149.0)
VLDL: 37 mg/dL (ref 0.0–40.0)

## 2022-06-27 LAB — T3, FREE: T3, Free: 3 pg/mL (ref 2.3–4.2)

## 2022-06-27 LAB — HEPATIC FUNCTION PANEL
ALT: 56 U/L — ABNORMAL HIGH (ref 0–53)
AST: 51 U/L — ABNORMAL HIGH (ref 0–37)
Albumin: 4.3 g/dL (ref 3.5–5.2)
Alkaline Phosphatase: 88 U/L (ref 39–117)
Bilirubin, Direct: 0.2 mg/dL (ref 0.0–0.3)
Total Bilirubin: 0.8 mg/dL (ref 0.2–1.2)
Total Protein: 7.7 g/dL (ref 6.0–8.3)

## 2022-06-27 LAB — VITAMIN B12: Vitamin B-12: 1230 pg/mL — ABNORMAL HIGH (ref 211–911)

## 2022-06-27 LAB — TSH: TSH: 5.94 u[IU]/mL — ABNORMAL HIGH (ref 0.35–5.50)

## 2022-06-27 LAB — HEMOGLOBIN A1C: Hgb A1c MFr Bld: 6.3 % (ref 4.6–6.5)

## 2022-06-27 LAB — PSA: PSA: 0.39 ng/mL (ref 0.10–4.00)

## 2022-06-27 LAB — VITAMIN D 25 HYDROXY (VIT D DEFICIENCY, FRACTURES): VITD: 32.26 ng/mL (ref 30.00–100.00)

## 2022-06-27 LAB — T4, FREE: Free T4: 0.77 ng/dL (ref 0.60–1.60)

## 2022-06-27 MED ORDER — METOPROLOL SUCCINATE ER 100 MG PO TB24: 50.0000 mg | ORAL_TABLET | Freq: Every day | ORAL | 3 refills | Status: AC

## 2022-06-27 MED ORDER — LEVOTHYROXINE SODIUM 200 MCG PO TABS: ORAL_TABLET | ORAL | 3 refills | Status: AC

## 2022-06-27 MED ORDER — FLUOXETINE HCL 40 MG PO CAPS: 40.0000 mg | ORAL_CAPSULE | Freq: Every day | ORAL | 5 refills | Status: DC

## 2022-06-27 MED ORDER — AZITHROMYCIN 250 MG PO TABS: ORAL_TABLET | ORAL | 3 refills | Status: DC

## 2022-06-27 MED ORDER — EZETIMIBE 10 MG PO TABS: 10.0000 mg | ORAL_TABLET | Freq: Every day | ORAL | 3 refills | Status: AC

## 2022-06-27 MED ORDER — COLCHICINE 0.6 MG PO TABS: 0.6000 mg | ORAL_TABLET | ORAL | 5 refills | Status: AC | PRN

## 2022-06-27 NOTE — Progress Notes (Signed)
Subjective:    Patient ID: Matthew Reese, male    DOB: 06-22-1971, 51 y.o.   MRN: [id number]  HPI Here for a well exam. He is doing well except he complains of constant fatigue. He sleeps well, and he is not sleepy. He says he is always tired. He has adjusted his diet and he has lost 23 lbs over the past year. He has seen some providers at the [place], and they decreased his Metoprolol to 1/2 tab daily. They took him off Citalopram and tried him on Sertraline, but he says he still feels depressed and anxious at times. He has a now job an a Investment banker, corporate, and he enjoys this much more than he did his former job. He has a hx of sleep apnea, and he had a septoplasty and turbinoplasty in April of last year, and he thinks he sleeps better now.    Review of Systems  Constitutional:  Positive for fatigue.  HENT: Negative.    Eyes: Negative.   Respiratory: Negative.    Cardiovascular: Negative.   Gastrointestinal: Negative.   Genitourinary: Negative.   Musculoskeletal: Negative.   Skin: Negative.   Neurological: Negative.   Psychiatric/Behavioral: Negative.         Objective:   Physical Exam Constitutional:      General: He is not in acute distress.    Appearance: Normal appearance. He is well-developed. He is not diaphoretic.  HENT:     Head: Normocephalic and atraumatic.     Right Ear: External ear normal.     Left Ear: External ear normal.     Nose: Nose normal.     Mouth/Throat:     Pharynx: No oropharyngeal exudate.  Eyes:     General: No scleral icterus.       Right eye: No discharge.        Left eye: No discharge.     Conjunctiva/sclera: Conjunctivae normal.     Pupils: Pupils are equal, round, and reactive to light.  Neck:     Thyroid: No thyromegaly.     Vascular: No JVD.     Trachea: No tracheal deviation.  Cardiovascular:     Rate and Rhythm: Normal rate and regular rhythm.     Heart sounds: Normal heart sounds. No murmur heard.    No friction rub. No gallop.   Pulmonary:     Effort: Pulmonary effort is normal. No respiratory distress.     Breath sounds: Normal breath sounds. No wheezing or rales.  Chest:     Chest wall: No tenderness.  Abdominal:     General: Bowel sounds are normal. There is no distension.     Palpations: Abdomen is soft. There is no mass.     Tenderness: There is no abdominal tenderness. There is no guarding or rebound.  Genitourinary:    Penis: Normal. No tenderness.      Testes: Normal.     Prostate: Normal.     Rectum: Normal. Guaiac result negative.  Musculoskeletal:        General: No tenderness. Normal range of motion.     Cervical back: Neck supple.  Lymphadenopathy:     Cervical: No cervical adenopathy.  Skin:    General: Skin is warm and dry.     Coloration: Skin is not pale.     Findings: No erythema or rash.  Neurological:     General: No focal deficit present.     Mental Status: He is alert and oriented to  person, place, and time.     Cranial Nerves: No cranial nerve deficit.     Motor: No abnormal muscle tone.     Coordination: Coordination normal.     Deep Tendon Reflexes: Reflexes are normal and symmetric. Reflexes normal.  Psychiatric:        Mood and Affect: Mood normal.        Behavior: Behavior normal.        Thought Content: Thought content normal.        Judgment: Judgment normal.           Assessment & Plan:  Well exam. We discussed diet and exercise. Get fasting labs. For the depression and anixety, he will stor Sertraline and try Fluoxetine 40 mg daily. He declines to get another colonoscopy. If his lab work does not reveal an etiology for his fatigue ,we will consider sending him for a repeat sleep study.  Alysia Penna, MD

## 2022-06-28 ENCOUNTER — Other Ambulatory Visit: Payer: Self-pay

## 2022-06-28 DIAGNOSIS — E039 Hypothyroidism, unspecified: Secondary | ICD-10-CM

## 2022-06-28 MED ORDER — LEVOTHYROXINE SODIUM 75 MCG PO TABS: 75.0000 ug | ORAL_TABLET | Freq: Every day | ORAL | 3 refills | Status: DC

## 2022-07-11 ENCOUNTER — Encounter: Payer: Self-pay | Admitting: Family Medicine

## 2022-07-11 DIAGNOSIS — M1A9XX0 Chronic gout, unspecified, without tophus (tophi): Secondary | ICD-10-CM

## 2022-07-11 NOTE — Telephone Encounter (Signed)
No we did not because I got the impression that his gout was not a problem lately. If he still wishes to get a uric acid level however, we certainly do that. I will put in the order

## 2022-07-19 ENCOUNTER — Other Ambulatory Visit: Payer: Self-pay | Admitting: Family Medicine

## 2022-08-17 ENCOUNTER — Other Ambulatory Visit: Payer: Self-pay | Admitting: Family Medicine

## 2022-09-20 ENCOUNTER — Ambulatory Visit (INDEPENDENT_AMBULATORY_CARE_PROVIDER_SITE_OTHER): Payer: 59

## 2022-09-20 ENCOUNTER — Ambulatory Visit: Payer: 59 | Admitting: Orthopaedic Surgery

## 2022-09-20 DIAGNOSIS — M25461 Effusion, right knee: Secondary | ICD-10-CM | POA: Diagnosis not present

## 2022-09-20 DIAGNOSIS — M25561 Pain in right knee: Secondary | ICD-10-CM | POA: Diagnosis not present

## 2022-09-20 MED ORDER — HYDROCODONE-ACETAMINOPHEN 5-325 MG PO TABS: 1.0000 | ORAL_TABLET | Freq: Four times a day (QID) | ORAL | 0 refills | Status: DC | PRN

## 2022-09-20 NOTE — Progress Notes (Signed)
The patient comes in today with acute right knee pain and swelling over the last 3 weeks with no known injury.  He does have a history of gout and he did double up on his colchicine and he said that has not helped in 800 mg ibuprofen does not help.  He is even tried a knee sleeve.  He has never had surgery on that knee and again denies any injury.  He has never had an injection in the knee either.  Examination of his right knee does show moderate effusion when comparing the right and left knees.  There is slight varus malalignment there is no ligamentous instability good range of motion knee but there certainly fullness and pain in the back of his knee with flexion past 90 degrees.  2 views of the right knee shows no acute findings other than effusion.  The joint spaces well-maintained in all 3 compartments.  I was able to aspirate about 25 cc of fluid from his knee.  It did not look like it was consistent with gout but is definitely an inflammatory process.  Certainly there could be a meniscal tear based on where he start with his knee.  I then placed a steroid in his knee.  I will have him continue his 800 mg ibuprofen and we will send an occasional hydrocodone to use sparingly.  I would like to reevaluate him in 2 weeks.  If he is still having depth, swelling and MRI would be warranted of the right knee.  All questions concerns were addressed and answered.

## 2022-09-28 ENCOUNTER — Emergency Department (HOSPITAL_COMMUNITY): Payer: 59

## 2022-09-28 ENCOUNTER — Other Ambulatory Visit: Payer: Self-pay

## 2022-09-28 ENCOUNTER — Emergency Department (HOSPITAL_COMMUNITY)
Admission: EM | Admit: 2022-09-28 | Discharge: 2022-09-28 | Disposition: A | Discharge status: Home / Self Care | Payer: No Typology Code available for payment source | Attending: Emergency Medicine | Admitting: Emergency Medicine

## 2022-09-28 DIAGNOSIS — Z79899 Other long term (current) drug therapy: Secondary | ICD-10-CM | POA: Diagnosis not present

## 2022-09-28 DIAGNOSIS — M25522 Pain in left elbow: Secondary | ICD-10-CM | POA: Insufficient documentation

## 2022-09-28 DIAGNOSIS — M25512 Pain in left shoulder: Secondary | ICD-10-CM | POA: Insufficient documentation

## 2022-09-28 DIAGNOSIS — I10 Essential (primary) hypertension: Secondary | ICD-10-CM | POA: Insufficient documentation

## 2022-09-28 MED ORDER — IBUPROFEN 800 MG PO TABS
800.0000 mg | ORAL_TABLET | Freq: Once | ORAL | Status: AC
  Administered 2022-09-28: 800 mg via ORAL
  Filled 2022-09-28: qty 1

## 2022-09-28 NOTE — Progress Notes (Signed)
Orthopedic Tech Progress Note Patient Details:  Matthew Reese June 19, 1971 [id number]  Ortho Devices Type of Ortho Device: Shoulder immobilizer Ortho Device/Splint Location: Left arm Ortho Device/Splint Interventions: Application   Post Interventions Patient Tolerated: Well  Linus Salmons Junious Ragone 09/28/2022, 12:29 PM

## 2022-09-28 NOTE — Discharge Instructions (Addendum)
Return to ED with any new or worsening signs or symptoms Please follow-up with orthopedics.  I have referred you. Please do range of motion exercises for your left shoulder throughout the day until seen by orthopedics You may take ibuprofen every 6 hours as needed for pain

## 2022-09-28 NOTE — ED Provider Notes (Signed)
[name] Note   CSN: [id number] Arrival date & time: 09/28/22  1033     History  Chief Complaint  Patient presents with   Shoulder Pain    Matthew Reese is a 52 y.o. male with history of fatty liver, anxiety, depression, GERD, gout, hypertension.  Patient presents to the ED for evaluation of left shoulder and elbow pain.  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  Patient reports that at some point during the day he had to lift up on a spring-loaded panel.  Patient reports that when he lifted up the spring-loaded panel he felt a "pop" in his left elbow.  Patient reports the pain then radiated up into his anterior left shoulder as well as down into his left wrist.  Patient also reporting that all 5 of his fingers are currently numb.  Patient denies any one-sided weakness.  Patient denies any slurred speech or facial droop.  Patient states that he took ibuprofen last night for pain relief.  Denies fevers.   Shoulder Pain Associated symptoms: no fever        Home Medications Prior to Admission medications   Medication Sig Start Date End Date Taking? Authorizing Provider  albuterol (VENTOLIN HFA) 108 (90 Base) MCG/ACT inhaler TAKE 2 PUFFS BY MOUTH EVERY 6 HOURS AS NEEDED FOR WHEEZE OR SHORTNESS OF BREATH 06/26/21   Laurey Morale, MD  azithromycin (ZITHROMAX Z-PAK) 250 MG tablet As directed 06/27/22   Laurey Morale, MD  colchicine 0.6 MG tablet Take 1 tablet (0.6 mg total) by mouth as needed (gout). 06/27/22   Laurey Morale, MD  ezetimibe (ZETIA) 10 MG tablet Take 1 tablet (10 mg total) by mouth daily. 06/27/22   Laurey Morale, MD  FLUoxetine (PROZAC) 40 MG capsule TAKE 1 CAPSULE (40 MG TOTAL) BY MOUTH DAILY. 08/17/22   Laurey Morale, MD  HYDROcodone-acetaminophen (NORCO/VICODIN) 5-325 MG tablet Take 1 tablet by mouth every 6 (six) hours as needed for moderate pain. 09/20/22   Mcarthur Rossetti, MD  ibuprofen (ADVIL) 800 MG tablet Take 1 tablet (800 mg total) by mouth every 6 (six) hours as needed. 12/29/19   Laurey Morale, MD  levothyroxine (SYNTHROID) 200 MCG tablet TAKE 1 TABLET (200 MCG TOTAL) BY MOUTH DAILY BEFORE BREAKFAST. 06/27/22   Laurey Morale, MD  levothyroxine (SYNTHROID) 75 MCG tablet Take 1 tablet (75 mcg total) by mouth daily. *take in addition to Levothyroxine 200 mcg* 06/28/22   Laurey Morale, MD  loratadine (CLARITIN) 10 MG tablet Take 10 mg by mouth daily.    [provider]  Melatonin 10 MG TABS Take 5 mg by mouth.    [provider]  methocarbamol (ROBAXIN) 750 MG tablet Take 1 tablet (750 mg total) by mouth every 8 (eight) hours as needed for muscle spasms. 11/27/21   Mcarthur Rossetti, MD  metoprolol succinate (TOPROL-XL) 100 MG 24 hr tablet Take 0.5 tablets (50 mg total) by mouth daily. Take with or immediately following a meal. 06/27/22   Laurey Morale, MD  omeprazole (PRILOSEC) 40 MG capsule TAKE 1 CAPSULE BY MOUTH EVERY DAY 06/26/21   Laurey Morale, MD      Allergies    Patient has no known allergies.    Review of Systems   Review of Systems  Constitutional:  Negative for fever.  Musculoskeletal:  Positive for arthralgias.  All other systems reviewed  and are negative.   Physical Exam Updated Vital Signs BP (!) 154/96 (BP Location: Right Arm)   Pulse 60   Temp 98.6 F (37 C) (Oral)   Resp 18   Ht 6' 9"$  (2.057 m)   Wt (!) 154.2 kg   SpO2 94%   BMI 36.43 kg/m  Physical Exam Vitals and nursing note reviewed.  Constitutional:      General: He is not in acute distress.    Appearance: Normal appearance. He is not ill-appearing, toxic-appearing or diaphoretic.  HENT:     Head: Normocephalic and atraumatic.     Nose: Nose normal.     Mouth/Throat:     Mouth: Mucous membranes are moist.     Pharynx: Oropharynx is clear.  Eyes:     Extraocular Movements: Extraocular movements intact.      Conjunctiva/sclera: Conjunctivae normal.     Pupils: Pupils are equal, round, and reactive to light.  Cardiovascular:     Rate and Rhythm: Normal rate and regular rhythm.  Pulmonary:     Effort: Pulmonary effort is normal.     Breath sounds: Normal breath sounds. No wheezing.  Abdominal:     General: Abdomen is flat. Bowel sounds are normal.     Palpations: Abdomen is soft.     Tenderness: There is no abdominal tenderness.  Musculoskeletal:     Right shoulder: Normal.     Left shoulder: Tenderness present. No swelling or deformity. Decreased range of motion.     Right elbow: Normal.     Left elbow: No swelling, deformity or effusion. Normal range of motion. Tenderness present.       Arms:     Cervical back: Normal range of motion and neck supple. No tenderness.     Comments: Patient left shoulder shows no overlying skin change, obvious deformity.  Patient does have anterior shoulder pain inferior to his clavicle.  Reduced flexion, reduced extension secondary to pain.  Patient left elbow shows no overlying skin change, obvious deformity.  Tenderness is medial.  Pain not reproduced on resisted pronation.  Skin:    General: Skin is warm and dry.     Capillary Refill: Capillary refill takes less than 2 seconds.  Neurological:     Mental Status: He is alert and oriented to person, place, and time.     ED Results / Procedures / Treatments   Labs (all labs ordered are listed, but only abnormal results are displayed) Labs Reviewed - No data to display  EKG None  Radiology DG Elbow Complete Left  Result Date: 09/28/2022 CLINICAL DATA:  patient heard pop in left elbow and now has pain up into left shoulder, this occurred yesterday, no deformity to biceps tendon noted Pt reports left shoulder and elbow pain since yesterday. 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 FINDINGS: No evidence of fracture of the ulna or humerus. The radial head is normal. No joint effusion. IMPRESSION: No fracture or dislocation. Electronically Signed   By: Suzy Bouchard M.D.   On: 09/28/2022 12:02   DG Shoulder Left  Result Date: 09/28/2022 CLINICAL DATA:  Felt a pop in left elbow now with pain up to left shoulder. EXAM: LEFT SHOULDER - 2+ VIEW COMPARISON:  None Available. FINDINGS: There is no acute fracture  or dislocation. Bony alignment is normal. The joint spaces are preserved. There is no erosive change. The soft tissues are unremarkable. IMPRESSION: Normal shoulder radiographs. Electronically Signed   By: Valetta Mole M.D.   On: 09/28/2022 12:02    Procedures Procedures    Medications Ordered in ED Medications  ibuprofen (ADVIL) tablet 800 mg (800 mg Oral Given 09/28/22 1128)    ED Course/ Medical Decision Making/ A&P  Medical Decision Making Amount and/or Complexity of Data Reviewed Radiology: ordered.  Risk Prescription drug management.   52 year old male presents to the ED for evaluation of left elbow and shoulder pain.  Please see HPI for further details.  On my examination the patient is afebrile and nontachycardic.  Lung sounds are clear bilaterally, he is not hypoxic on room.  Abdomen is soft and compressible throughout.  Neuroexam shows no focal neurodeficits.  Patient left shoulder and left elbow have no overlying skin change.  Patient left shoulder has reduced range of motion, reduced flexion and extension secondary to pain.  No obvious deformity noted however.  Patient left elbow also has no overlying skin change, no obvious deformity.  The tenderness is located medially.  The pain is not reproduced on resisted pronation.  At this time, x-ray imaging of patient left shoulder and elbow showed no signs of acute fracture.  Patient will be placed in shoulder  sling and referred to orthopedics for further management.  Return precautions were provided and the patient voiced understanding.  Patient had all of his questions answered to his satisfaction.  Patient stable for discharge.   Final Clinical Impression(s) / ED Diagnoses Final diagnoses:  Acute pain of left shoulder  Left elbow pain    Rx / DC Orders ED Discharge Orders     None         Azucena Cecil, PA-C 09/28/22 1241    Sherwood Gambler, MD 09/29/22 [phone]

## 2022-09-28 NOTE — ED Triage Notes (Signed)
Pt reports left shoulder and elbow pain since yesterday. 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

## 2022-09-30 ENCOUNTER — Other Ambulatory Visit: Payer: Self-pay | Admitting: Family Medicine

## 2022-10-03 ENCOUNTER — Telehealth: Payer: Self-pay | Admitting: Family Medicine

## 2022-10-03 ENCOUNTER — Other Ambulatory Visit (INDEPENDENT_AMBULATORY_CARE_PROVIDER_SITE_OTHER): Payer: 59

## 2022-10-03 DIAGNOSIS — M1A9XX0 Chronic gout, unspecified, without tophus (tophi): Secondary | ICD-10-CM

## 2022-10-03 DIAGNOSIS — E039 Hypothyroidism, unspecified: Secondary | ICD-10-CM

## 2022-10-03 DIAGNOSIS — R7303 Prediabetes: Secondary | ICD-10-CM

## 2022-10-03 LAB — T3, FREE: T3, Free: 3.6 pg/mL (ref 2.3–4.2)

## 2022-10-03 LAB — URIC ACID: Uric Acid, Serum: 7.8 mg/dL (ref 4.0–7.8)

## 2022-10-03 LAB — TSH: TSH: 0.01 u[IU]/mL — ABNORMAL LOW (ref 0.35–5.50)

## 2022-10-03 LAB — T4, FREE: Free T4: 1.26 ng/dL (ref 0.60–1.60)

## 2022-10-03 NOTE — Addendum Note (Signed)
Addended by: Rosalyn Gess D on: 10/03/2022 08:16 AM   Modules accepted: Orders

## 2022-10-03 NOTE — Telephone Encounter (Signed)
Pt is requesting a blood glucose monitor.  Pharmacy- [facility] [place]

## 2022-10-08 ENCOUNTER — Telehealth: Payer: Self-pay | Admitting: Orthopaedic Surgery

## 2022-10-08 ENCOUNTER — Encounter: Payer: Self-pay | Admitting: Family Medicine

## 2022-10-08 ENCOUNTER — Telehealth: Payer: Self-pay

## 2022-10-08 DIAGNOSIS — R7303 Prediabetes: Secondary | ICD-10-CM | POA: Insufficient documentation

## 2022-10-08 DIAGNOSIS — M25561 Pain in right knee: Secondary | ICD-10-CM

## 2022-10-08 NOTE — Telephone Encounter (Signed)
Patient called and stated that his knee is not really any better. Can we cancel his follow up appointment and just proceed with ordering MRI?

## 2022-10-08 NOTE — Addendum Note (Signed)
Addended by: Robyne Peers on: 10/08/2022 08:38 AM   Modules accepted: Orders

## 2022-10-08 NOTE — Telephone Encounter (Signed)
Pt Rx was faxed to [facility] per pt request

## 2022-10-08 NOTE — Telephone Encounter (Signed)
This is OTC. Insurance will not cover this

## 2022-10-08 NOTE — Telephone Encounter (Signed)
The written RX for this is on your desk

## 2022-10-08 NOTE — Telephone Encounter (Signed)
Mri ordered. Called and advised pt.

## 2022-10-08 NOTE — Telephone Encounter (Signed)
Patient asking if he needs to keep his appointment for follow up on the 29th, he is getting scheduled for MRI. Should he just Wait..please advise

## 2022-10-09 ENCOUNTER — Encounter: Payer: Self-pay | Admitting: Family Medicine

## 2022-10-10 NOTE — Telephone Encounter (Signed)
I am sorry but I cannot do that

## 2022-10-11 ENCOUNTER — Encounter: Payer: Self-pay | Admitting: Orthopaedic Surgery

## 2022-10-11 ENCOUNTER — Ambulatory Visit: Payer: 59 | Admitting: Orthopaedic Surgery

## 2022-10-12 NOTE — Telephone Encounter (Signed)
Yes make an OV so we can discuss this

## 2022-10-16 ENCOUNTER — Encounter: Payer: Self-pay | Admitting: Family Medicine

## 2022-10-16 ENCOUNTER — Ambulatory Visit: Payer: 59 | Admitting: Family Medicine

## 2022-10-16 VITALS — BP 126/80 | HR 61 | Temp 98.6°F | Wt 327.0 lb

## 2022-10-16 DIAGNOSIS — R5382 Chronic fatigue, unspecified: Secondary | ICD-10-CM

## 2022-10-16 DIAGNOSIS — J4520 Mild intermittent asthma, uncomplicated: Secondary | ICD-10-CM

## 2022-10-16 DIAGNOSIS — M159 Polyosteoarthritis, unspecified: Secondary | ICD-10-CM | POA: Diagnosis not present

## 2022-10-16 NOTE — Progress Notes (Unsigned)
   Subjective:    Patient ID: Matthew Reese, male    DOB: 1970-12-25, 52 y.o.   MRN: [id number]  HPI Here to ask for help with several issues. First he has been dealing with chronic fatigue and asthma ever since his service with the [employer] during the [employer] from 1990 to 1993. He has applied for benefits from the [place] related to his exposure to dust and agents used during this time. He asks [place] to write a NEXUS letter to the [place] to state our medical opinions about this. Also he has been using CBD oil from a friend to help with his chronic back pain, and this has been very helpful. This is legal product of course. However he tested positive for [facility] in a recent job related drug test, and his company may fire him for this. He asks [place] to compose a letter explaining this situation to document that this substance is legal and does not affect his ability to drive on his job.    Review of Systems     Objective:   Physical Exam        Assessment & Plan:

## 2022-10-17 ENCOUNTER — Encounter: Payer: Self-pay | Admitting: Family Medicine

## 2022-10-17 ENCOUNTER — Other Ambulatory Visit: Payer: Self-pay

## 2022-10-17 DIAGNOSIS — J45909 Unspecified asthma, uncomplicated: Secondary | ICD-10-CM | POA: Insufficient documentation

## 2022-10-18 ENCOUNTER — Encounter: Payer: Self-pay | Admitting: Radiology

## 2022-10-18 NOTE — Telephone Encounter (Signed)
It would be best if he came by to pick up both signed letters

## 2022-10-25 ENCOUNTER — Ambulatory Visit
Admission: RE | Admit: 2022-10-25 | Discharge: 2022-10-25 | Disposition: A | Discharge status: Home / Self Care | Payer: 59 | Source: Ambulatory Visit | Attending: Orthopaedic Surgery | Admitting: Orthopaedic Surgery

## 2022-10-25 DIAGNOSIS — M25561 Pain in right knee: Secondary | ICD-10-CM

## 2022-11-01 ENCOUNTER — Ambulatory Visit: Payer: 59 | Admitting: Orthopaedic Surgery

## 2022-11-01 ENCOUNTER — Encounter: Payer: Self-pay | Admitting: Orthopaedic Surgery

## 2022-11-01 DIAGNOSIS — S83231D Complex tear of medial meniscus, current injury, right knee, subsequent encounter: Secondary | ICD-10-CM

## 2022-11-01 NOTE — Progress Notes (Signed)
The patient comes in today to go over an MRI of his right knee.  He has been having medial joint line tenderness and locking catching with that right knee.  He has tried and failed conservative treatment including activity modification and quad strengthening as well as rest.  He had anti-inflammatories and a steroid injection in that knee.  His plain films showed a well-maintained joint space.  The MRI of his right knee does show a complex tear which is horizontal in nature of the posterior medial meniscus.  The cartilage shows no deficit on that side of his knee.  The remainder of the MRI does not show any significant findings.  On exam he still has medial joint line tenderness and a positive Murray's exam on the medial aspect of his right knee.  This correlates with his MRI findings and what he is describing with locking catching and pain.  This point I did show him a knee model and recommended an arthroscopic intervention for that knee.  I described what this type of surgery involves and described the risk and benefits of surgery.  Given his meniscal tear and his continued symptoms he is interested in having the surgery so we will work on getting it scheduled.  All question concerns were answered and addressed.

## 2022-11-06 ENCOUNTER — Encounter: Payer: Self-pay | Admitting: Family Medicine

## 2022-11-06 NOTE — Telephone Encounter (Signed)
Okay. Take one capsule every other day for one week and then stop it

## 2022-11-09 ENCOUNTER — Encounter: Payer: Self-pay | Admitting: Family Medicine

## 2022-11-12 ENCOUNTER — Encounter: Payer: Self-pay | Admitting: Orthopaedic Surgery

## 2022-11-12 NOTE — Telephone Encounter (Signed)
These results are somewhat confusing. It does not look like he has COPD, be he could have asthma. He should really discuss these results with a Pulmonologist since I am not an expert at reading these

## 2022-11-16 NOTE — Telephone Encounter (Signed)
I called patient and scheduled surgery. 

## 2022-11-29 ENCOUNTER — Other Ambulatory Visit: Payer: Self-pay | Admitting: Orthopaedic Surgery

## 2022-11-29 DIAGNOSIS — S83221A Peripheral tear of medial meniscus, current injury, right knee, initial encounter: Secondary | ICD-10-CM | POA: Diagnosis not present

## 2022-11-29 MED ORDER — OXYCODONE-ACETAMINOPHEN 5-325 MG PO TABS: 1.0000 | ORAL_TABLET | Freq: Four times a day (QID) | ORAL | 0 refills | Status: DC | PRN

## 2022-12-06 ENCOUNTER — Ambulatory Visit (INDEPENDENT_AMBULATORY_CARE_PROVIDER_SITE_OTHER): Payer: 59 | Admitting: Physician Assistant

## 2022-12-06 ENCOUNTER — Encounter: Payer: Self-pay | Admitting: Physician Assistant

## 2022-12-06 DIAGNOSIS — Z9889 Other specified postprocedural states: Secondary | ICD-10-CM

## 2022-12-06 NOTE — Progress Notes (Signed)
HPI: Matthew Reese comes back today status post right knee arthroscopy 11/29/2022.  States he has some swelling just distal to his knee.  States knee is sore in this area.  Ranks pain to be 10 out of 10 at worst.  He is walking though without any assistive device.  Right knee arthroscopy revealed with significant effusion.  He underwent a partial medial meniscectomy.  Lateral compartment showed meniscus and cartilage.  Mild chondromalacia of the patella was encountered.  Physical exam: General well-developed well-nourished male is able to ambulate on his own without any assistive device. Right knee: Port sites are well-approximated with nylon sutures no signs of infection.  He has full extension flexion is just beyond 90 degrees.  Calf supple nontender.  Slight bruising over the anterior medial aspect of the proximal tibia.  Impression: Status post right knee arthroscopy  Plan: Sutures removed he will work on scar tissue mobilization.  No submerging of the knee until the wound completely healed.  He will follow-up with [place] in 4 weeks.  In the interim he will work on range of motion and strengthening the knee discussed knee friendly exercises with him.  Knee arthroscopy images were reviewed with the patient today.

## 2022-12-23 ENCOUNTER — Other Ambulatory Visit: Payer: Self-pay | Admitting: Orthopaedic Surgery

## 2022-12-24 MED ORDER — OXYCODONE-ACETAMINOPHEN 5-325 MG PO TABS: 1.0000 | ORAL_TABLET | Freq: Four times a day (QID) | ORAL | 0 refills | Status: DC | PRN

## 2023-01-08 ENCOUNTER — Encounter: Payer: Self-pay | Admitting: Orthopaedic Surgery

## 2023-01-08 ENCOUNTER — Ambulatory Visit (INDEPENDENT_AMBULATORY_CARE_PROVIDER_SITE_OTHER): Payer: Self-pay | Admitting: Orthopaedic Surgery

## 2023-01-08 DIAGNOSIS — Z9889 Other specified postprocedural states: Secondary | ICD-10-CM

## 2023-01-08 NOTE — Progress Notes (Signed)
The patient is now about 6 weeks status post a right knee arthroscopy with partial meniscectomy.  He feels like he is getting better.  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  He is not back to work but he said he can go back as soon as even today.  On exam his right knee shows no effusion with full range of motion.  Feels ligamentously stable.  I did review the arthroscopy pictures again and I did not see where the cartilage and the main weightbearing compartments of the knee.  At this point he can just work on quad strengthening exercises and will increase his activities as comfort allows.  If things worsen he knows to let us know.  Follow-up is as needed.

## 2023-03-14 ENCOUNTER — Encounter: Payer: Self-pay | Admitting: Physician Assistant

## 2023-03-14 ENCOUNTER — Other Ambulatory Visit (INDEPENDENT_AMBULATORY_CARE_PROVIDER_SITE_OTHER): Payer: No Typology Code available for payment source

## 2023-03-14 ENCOUNTER — Ambulatory Visit: Payer: No Typology Code available for payment source | Admitting: Physician Assistant

## 2023-03-14 DIAGNOSIS — M542 Cervicalgia: Secondary | ICD-10-CM

## 2023-03-14 DIAGNOSIS — M792 Neuralgia and neuritis, unspecified: Secondary | ICD-10-CM

## 2023-03-14 MED ORDER — CYCLOBENZAPRINE HCL 10 MG PO TABS
10.0000 mg | ORAL_TABLET | Freq: Every day | ORAL | 0 refills | Status: DC
Start: 2023-03-14 — End: 2024-06-29
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
## 2023-03-14 NOTE — Addendum Note (Signed)
Addended by: Barbette Or on: 03/14/2023 04:55 PM   Modules accepted: Orders

## 2023-04-01 ENCOUNTER — Ambulatory Visit
Admission: RE | Admit: 2023-04-01 | Discharge: 2023-04-01 | Disposition: A | Discharge status: Home / Self Care | Payer: Self-pay | Source: Ambulatory Visit | Attending: Physician Assistant | Admitting: Physician Assistant

## 2023-04-01 DIAGNOSIS — M542 Cervicalgia: Secondary | ICD-10-CM

## 2023-04-08 ENCOUNTER — Encounter: Payer: Self-pay | Admitting: Physician Assistant

## 2023-04-08 ENCOUNTER — Ambulatory Visit (INDEPENDENT_AMBULATORY_CARE_PROVIDER_SITE_OTHER): Payer: No Typology Code available for payment source | Admitting: Physician Assistant

## 2023-04-08 ENCOUNTER — Ambulatory Visit: Payer: No Typology Code available for payment source

## 2023-04-08 DIAGNOSIS — M542 Cervicalgia: Secondary | ICD-10-CM

## 2023-04-08 DIAGNOSIS — R200 Anesthesia of skin: Secondary | ICD-10-CM

## 2023-04-08 DIAGNOSIS — M25522 Pain in left elbow: Secondary | ICD-10-CM

## 2023-04-08 NOTE — Progress Notes (Signed)
HPI: Matthew Reese returns today to go over the scan of his cervical spine.  Unfortunately was a CT scan was performed.  He is still states he is having pain down both arms with numbness tingling in both hands.  States the right is more occasional whereas the left hand he has numbness most times involving the whole hand.  He also notes burning in his left shoulder region.  Does feel Flexeril does help some.  In regards to his elbow again an MRI did show a partial tear of his distal biceps tendon and partial tear of his common extensor at the origin.  Moderate medial and lateral compartmental arthritic changes of the elbow were also seen.  States he still has to be careful when doing any work using his left hand.  Regards to the numbness in his hands he states that he had some numbness in his hands prior to his work-related injury on 09/28/2022.  However he notes that the numbness tingling has became worse since that time.  Review of systems: See HPI otherwise negative  Physical exam: General Well-developed well-nourished pleasant male in no acute distress. Vascular: Radial pulses are 2+ and equal and symmetric bilaterally. Bilateral hands subjective decrease sensation left thumb to light touch.  Tinel's over the median nerve at the wrist bilaterally is negative.  Compression test over the median nerve at the wrist bilaterally is negative.  Phalen's test is positive on the left negative on the right.  Cervical spine: He has tenderness over the lower cervical spinal column from C6 down to C7.  Tenderness medial border of the left scapula only.    Left elbow: Full range of motion.  Distal biceps tenderness near the insertion.  Tendon is palpable no obvious defects palpated.  CT scan: No spinal listhesis.  Degenerative changes C5-C6 and C6-C7 with endplate spurring and mild disc height loss.  Facet arthropathy mid to lower cervical spine.  No significant evidence of canal stenosis.  Mild stenosis at multiple levels  most notably at C5-C6 on the right.  Impression: Bilateral upper extremities radiculopathy Left elbow distal biceps partial tear/common extensor partial tear   Plan: Will have him undergo EMG nerve conduction studies to rule out carpal tunnel particularly on the left side.  Will have him undergo an MRI of the cervical spine to evaluate for HNP as source of his radicular symptoms.  Questions were encouraged and answered at length.  Placed him no heavy lifting or grasping with the left hand.

## 2023-04-10 ENCOUNTER — Ambulatory Visit (INDEPENDENT_AMBULATORY_CARE_PROVIDER_SITE_OTHER): Payer: No Typology Code available for payment source | Admitting: Physical Medicine and Rehabilitation

## 2023-04-10 DIAGNOSIS — R202 Paresthesia of skin: Secondary | ICD-10-CM | POA: Diagnosis not present

## 2023-04-10 DIAGNOSIS — M542 Cervicalgia: Secondary | ICD-10-CM

## 2023-04-10 DIAGNOSIS — M79642 Pain in left hand: Secondary | ICD-10-CM

## 2023-04-10 DIAGNOSIS — M5412 Radiculopathy, cervical region: Secondary | ICD-10-CM | POA: Diagnosis not present

## 2023-04-10 DIAGNOSIS — M79641 Pain in right hand: Secondary | ICD-10-CM

## 2023-04-10 NOTE — Progress Notes (Signed)
Functional Pain Scale - descriptive words and definitions  Distressing (6)    Pain is present/unable to complete most ADLs limited by pain/sleep is difficult and active distraction is only marginal. Moderate range order  Average Pain 5  Pain and numbness in left arm and hand, numbness in right hand.

## 2023-04-12 DIAGNOSIS — R440 Auditory hallucinations: Secondary | ICD-10-CM | POA: Insufficient documentation

## 2023-04-12 DIAGNOSIS — F4312 Post-traumatic stress disorder, chronic: Secondary | ICD-10-CM | POA: Insufficient documentation

## 2023-04-12 DIAGNOSIS — F17201 Nicotine dependence, unspecified, in remission: Secondary | ICD-10-CM | POA: Insufficient documentation

## 2023-04-12 DIAGNOSIS — M545 Low back pain, unspecified: Secondary | ICD-10-CM | POA: Insufficient documentation

## 2023-04-12 DIAGNOSIS — H9313 Tinnitus, bilateral: Secondary | ICD-10-CM | POA: Insufficient documentation

## 2023-04-12 DIAGNOSIS — M25561 Pain in right knee: Secondary | ICD-10-CM | POA: Insufficient documentation
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
## 2023-04-12 NOTE — Procedures (Signed)
EMG & NCV Findings: Evaluation of the left median motor and the right median motor nerves showed prolonged distal onset latency (L5.2, R5.2 ms) and decreased conduction velocity (Elbow-Wrist, L46, R45 m/s).  The left ulnar motor nerve showed decreased conduction velocity (B Elbow-Wrist, 52 m/s).  The left median (across palm) sensory nerve showed prolonged distal peak latency (Wrist, 5.4 ms) and prolonged distal peak latency (Palm, 2.3 ms).  The right median (across palm) sensory nerve showed prolonged distal peak latency (Wrist, 6.1 ms), reduced amplitude (6.8 V), and prolonged distal peak latency (Palm, 2.7 ms).  All remaining nerves (as indicated in the following tables) were within normal limits.  All left vs. right side differences were within normal limits.    Needle evaluation of the left abductor pollicis brevis muscle showed increased insertional activity, slightly increased spontaneous activity, and diminished recruitment.  All remaining muscles (as indicated in the following table) showed no evidence of electrical instability.    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   There is no significant electrodiagnostic evidence of any other focal nerve entrapment, brachial plexopathy or cervical radiculopathy.  As you know, this particular electrodiagnostic study cannot rule out chemical radiculitis or sensory only radiculopathy.  Recommendations: 1.  Follow-up with referring physician.  Review MRI of cervical spine when available. 2.  Continue current management of symptoms. 3.  Continue use of resting splint at night-time and as needed during the day. 4.  Suggest surgical evaluation.  ___________________________ Naaman Plummer FAAPMR Board Certified, American Board of Physical Medicine and Rehabilitation    Nerve Conduction  Studies Anti Sensory Summary Table   Stim Site NR Peak (ms) Norm Peak (ms) P-T Amp (V) Norm P-T Amp Site1 Site2 Delta-P (ms) Dist (cm) Vel (m/s) Norm Vel (m/s)  Left Median Acr Palm Anti Sensory (2nd Digit)  32.7C  Wrist    *5.4 <3.6 13.8 >10 Wrist Palm 3.1 0.0    Palm    *2.3 <2.0 12.6         Right Median Acr Palm Anti Sensory (2nd Digit)  32.2C  Wrist    *6.1 <3.6 *6.8 >10 Wrist Palm 3.4 0.0    Palm    *2.7 <2.0 5.3         Left Radial Anti Sensory (Base 1st Digit)  32.1C  Wrist    1.9 <3.1 19.3  Wrist Base 1st Digit 1.9 0.0    Right Radial Anti Sensory (Base 1st Digit)  31.1C  Wrist    2.2 <3.1 19.0  Wrist Base 1st Digit 2.2 0.0    Left Ulnar Anti Sensory (5th Digit)  32.7C  Wrist    3.4 <3.7 15.9 >15.0 Wrist 5th Digit 3.4 14.0 41 >38  Right Ulnar Anti Sensory (5th Digit)  32.8C  Wrist    3.0 <3.7 18.8 >15.0 Wrist 5th Digit 3.0 14.0 47 >38   Motor Summary Table   Stim Site NR Onset (ms) Norm Onset (ms) O-P Amp (mV) Norm O-P Amp Site1 Site2 Delta-0 (ms) Dist (cm) Vel (m/s) Norm Vel (m/s)  Left Median Motor (Abd Poll Brev)  32.3C  Wrist    *5.2 <4.2 6.9 >5 Elbow Wrist 5.7 26.5 *46 >50  Elbow    10.9  2.9         Right Median Motor (Abd Poll Brev)  32.3C  Wrist    *5.2 <4.2 6.0 >5 Elbow  Wrist 5.6 25.0 *45 >50  Elbow    10.8  5.7         Left Ulnar Motor (Abd Dig Min)  32.4C  Wrist    3.1 <4.2 7.5 >3 B Elbow Wrist 5.1 26.5 *52 >53  B Elbow    8.2  6.2  A Elbow B Elbow 1.5 12.0 80 >53  A Elbow    9.7  6.6         Right Ulnar Motor (Abd Dig Min)  32.7C  Wrist    3.0 <4.2 6.6 >3 Axilla A Elbow 1.4 0.0    A Elbow    8.1  6.7         Axilla    9.5  6.4          EMG   Side Muscle Nerve Root Ins Act Fibs Psw Amp Dur Poly Recrt Int Dennie Bible Comment  Left Abd Poll Brev Median C8-T1 *Incr *1+ *1+ Nml Nml 0 *Reduced Nml   Left 1stDorInt Ulnar C8-T1 Nml Nml Nml Nml Nml 0 Nml Nml   Left PronatorTeres Median C6-7 Nml Nml Nml Nml Nml 0 Nml Nml   Left Biceps Musculocut C5-6 Nml Nml  Nml Nml Nml 0 Nml Nml   Left Deltoid Axillary C5-6 Nml Nml Nml Nml Nml 0 Nml Nml     Nerve Conduction Studies Anti Sensory Left/Right Comparison   Stim Site L Lat (ms) R Lat (ms) L-R Lat (ms) L Amp (V) R Amp (V) L-R Amp (%) Site1 Site2 L Vel (m/s) R Vel (m/s) L-R Vel (m/s)  Median Acr Palm Anti Sensory (2nd Digit)  32.7C  Wrist *5.4 *6.1 0.7 13.8 *6.8 50.7 Wrist Palm     Palm *2.3 *2.7 0.4 12.6 5.3 57.9       Radial Anti Sensory (Base 1st Digit)  32.1C  Wrist 1.9 2.2 0.3 19.3 19.0 1.6 Wrist Base 1st Digit     Ulnar Anti Sensory (5th Digit)  32.7C  Wrist 3.4 3.0 0.4 15.9 18.8 15.4 Wrist 5th Digit 41 47 6   Motor Left/Right Comparison   Stim Site L Lat (ms) R Lat (ms) L-R Lat (ms) L Amp (mV) R Amp (mV) L-R Amp (%) Site1 Site2 L Vel (m/s) R Vel (m/s) L-R Vel (m/s)  Median Motor (Abd Poll Brev)  32.3C  Wrist *5.2 *5.2 0.0 6.9 6.0 13.0 Elbow Wrist *46 *45 1  Elbow 10.9 10.8 0.1 2.9 5.7 49.1       Ulnar Motor (Abd Dig Min)  32.4C  Wrist 3.1 3.0 0.1 7.5 6.6 12.0 B Elbow Wrist *52    B Elbow 8.2   6.2   A Elbow B Elbow 80    A Elbow 9.7 8.1 1.6 6.6 6.7 1.5          Waveforms:

## 2023-04-17 ENCOUNTER — Telehealth: Payer: Self-pay

## 2023-04-17 NOTE — Telephone Encounter (Signed)
Morrie Sheldon with [facility] called concerning approval for MRI C-Spine through the [place].  Stated that additional information is needed and that she would fax over the form to be completed.

## 2023-04-17 NOTE — Telephone Encounter (Signed)
Patient asking for someone to call him about his scan. He is scheduled for one this Friday at the [place] but stated the incorrect paperwork was filled out? [phone]

## 2023-04-18 NOTE — Telephone Encounter (Signed)
Received the fax yesterday afternoon. Working on this and will email it to the [place]. Patient has been advised of this. Please see other message on this subject from yesterday/today.

## 2023-04-18 NOTE — Telephone Encounter (Signed)
Thanks Terri.  Let me know if you need help getting auth for [place] to treat for neck.

## 2023-04-18 NOTE — Telephone Encounter (Signed)
I called the patient to get some more information on what is needed -- we just received the fax from the [place] yesterday afternoon. He said the original referral was for his elbow (was supposed to also be for the shoulder). A new form is needed to show why the MRI is for the Csp -- how that is related to the elbow pain. The patient said he went ahead and cancelled the MRI that was set for tomorrow at [place], but would like it rescheduled at [facility] once we get approval to proceed. I advised him I am working on the form, but might need some  help from [place], who will be in the office this afternoon. I also advised the patient that he will be notified of the determination by the [place] and if we are able to proceed with the MRI Csp. He voiced understanding.

## 2023-04-22 ENCOUNTER — Encounter: Payer: Self-pay | Admitting: Physician Assistant

## 2023-04-22 ENCOUNTER — Ambulatory Visit (INDEPENDENT_AMBULATORY_CARE_PROVIDER_SITE_OTHER): Payer: No Typology Code available for payment source | Admitting: Physician Assistant

## 2023-04-22 DIAGNOSIS — G5601 Carpal tunnel syndrome, right upper limb: Secondary | ICD-10-CM | POA: Diagnosis not present

## 2023-04-22 DIAGNOSIS — G5602 Carpal tunnel syndrome, left upper limb: Secondary | ICD-10-CM | POA: Diagnosis not present

## 2023-04-22 DIAGNOSIS — M5412 Radiculopathy, cervical region: Secondary | ICD-10-CM
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
## 2023-05-02 ENCOUNTER — Telehealth: Payer: Self-pay

## 2023-05-02 NOTE — Telephone Encounter (Signed)
What did we ever come up with cspine MRI?

## 2023-05-07 NOTE — Telephone Encounter (Signed)
Still pending auth from [place]

## 2023-05-15 ENCOUNTER — Ambulatory Visit: Payer: No Typology Code available for payment source | Admitting: Orthopedic Surgery

## 2023-05-17 ENCOUNTER — Other Ambulatory Visit: Payer: Self-pay | Admitting: Family Medicine

## 2023-05-17 DIAGNOSIS — Z1212 Encounter for screening for malignant neoplasm of rectum: Secondary | ICD-10-CM

## 2023-05-17 DIAGNOSIS — Z1211 Encounter for screening for malignant neoplasm of colon: Secondary | ICD-10-CM

## 2023-08-24 IMAGING — MR MR LUMBAR SPINE W/O CM
4 of 5 series · 25 of 48 positions shown · non-contrast
Comparison: Lumbar spine radiographs 11/27/2021

CLINICAL DATA: Low back pain radiating to left hip down knee with
weakness for 4 months

EXAM:
MRI LUMBAR SPINE WITHOUT CONTRAST
TECHNIQUE: Multiplanar, multisequence MR imaging of the lumbar spine was
performed. No intravenous contrast was administered.

[Series 5: T2 · sagittal · 4.0mm · 1.25mm/px · 5 of 24 slices shown (1 of 2)]
[im 1/24]
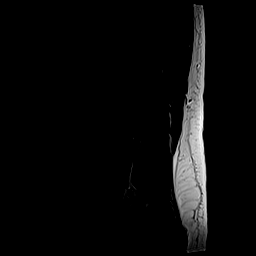
[im 6/24]
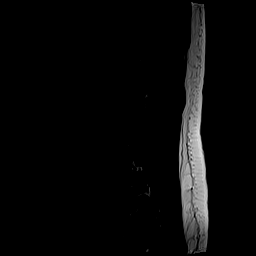
[im 12/24]
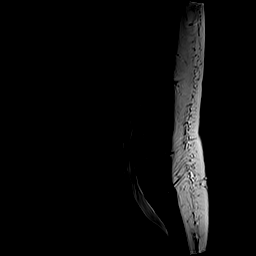
[im 18/24]
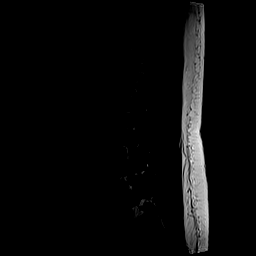
[im 24/24]
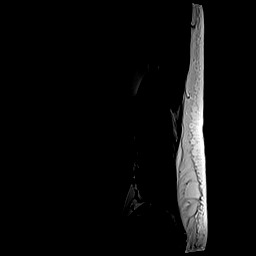

[Series 6: T1 · sagittal · 4.0mm · 1.25mm/px · 5 of 24 slices shown (1 of 2)]
[im 1/24]
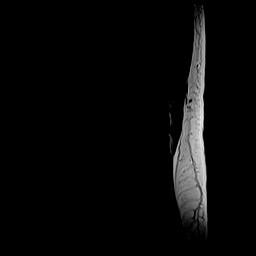
[im 6/24]
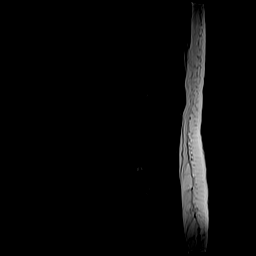
[im 12/24]
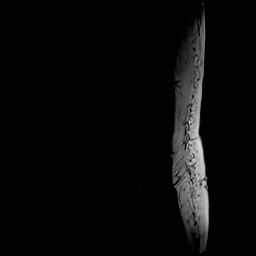
[im 18/24]
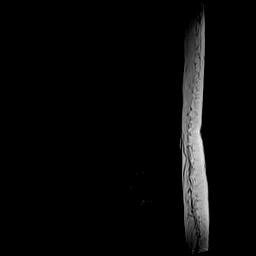
[im 24/24]
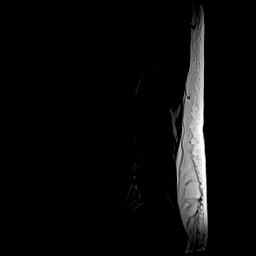

[Series 10: T1 · axial · 4.0mm · 0.41mm/px · z∈[-86,+182]mm · 5 of 67 slices shown (2 of 2)]
[im 5/67]
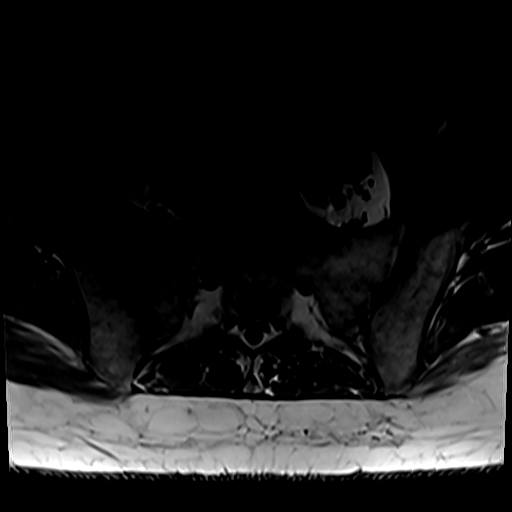
[im 9/67]
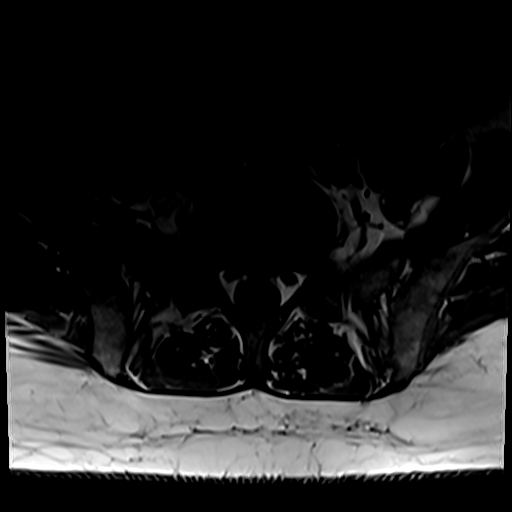
[im 14/67]
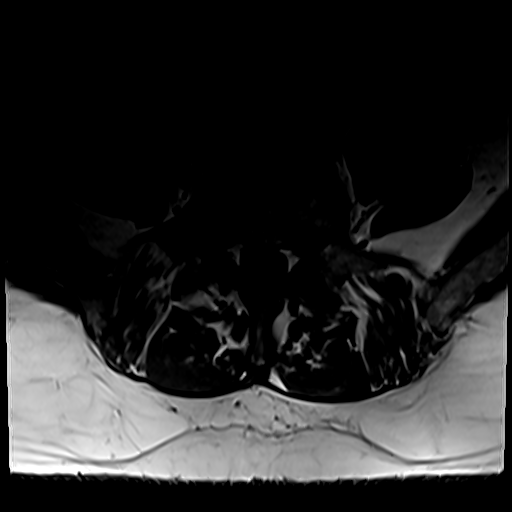
[im 36/67]
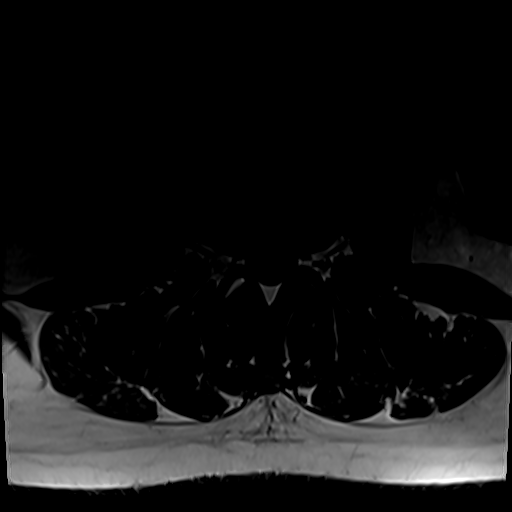
[im 58/67]
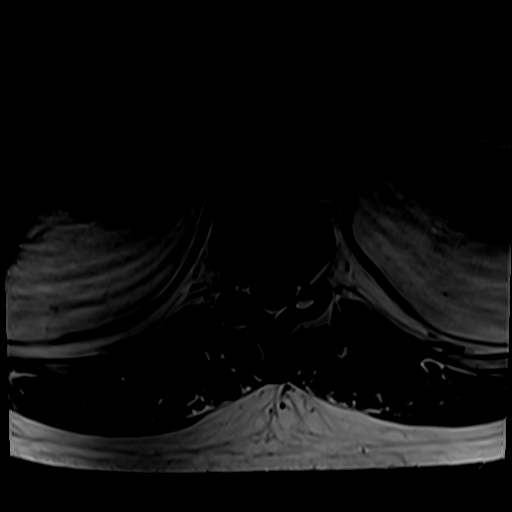

[Series 13: T2 · axial · 4.0mm · 0.41mm/px · z∈[-86,+227]mm · 10 of 67 slices shown (2 of 2)]
[im 5/67]
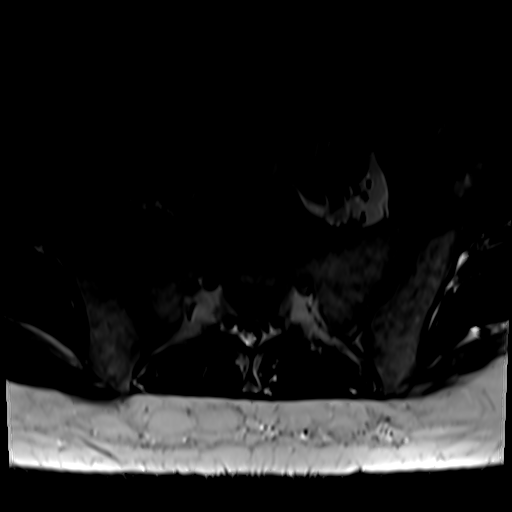
[im 9/67]
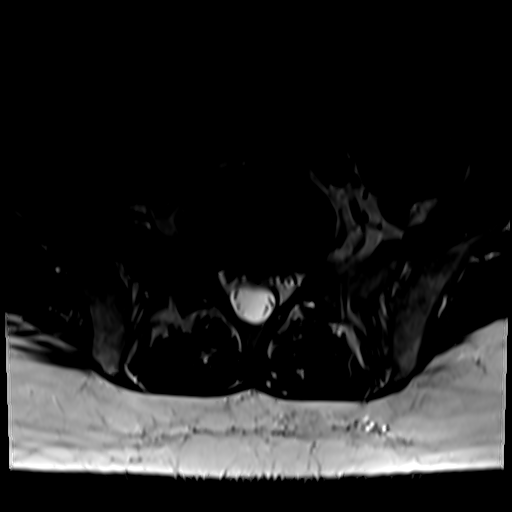
[im 14/67]
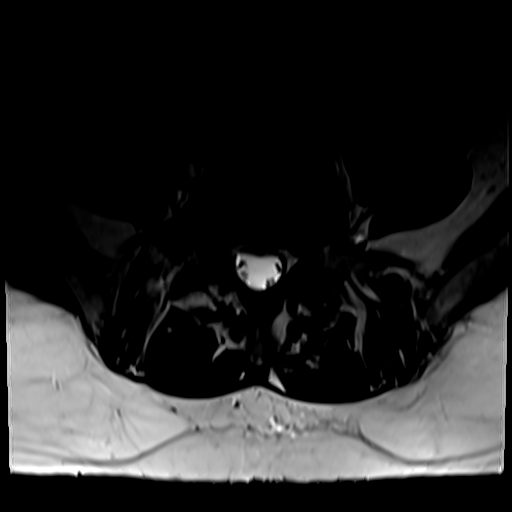
[im 23/67]
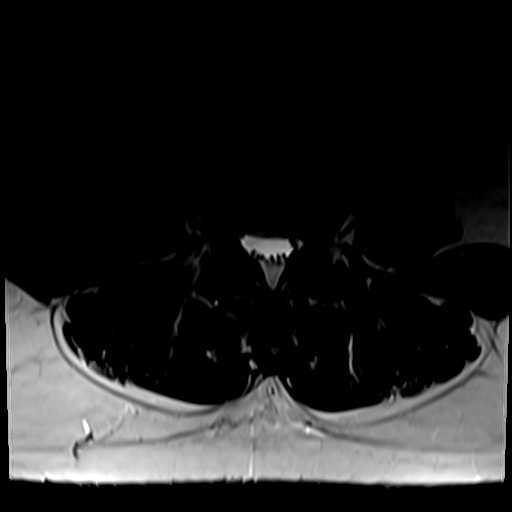
[im 31/67]
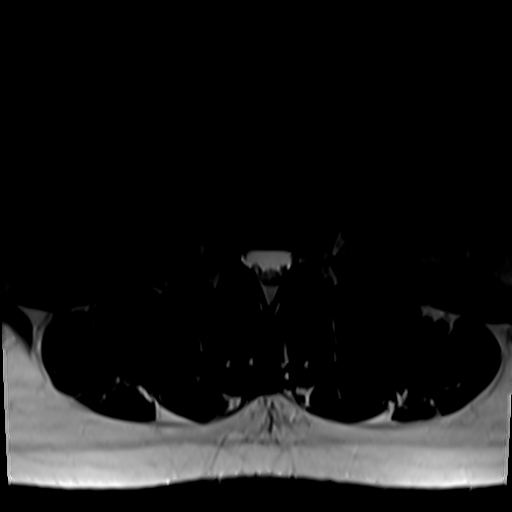
[im 36/67]
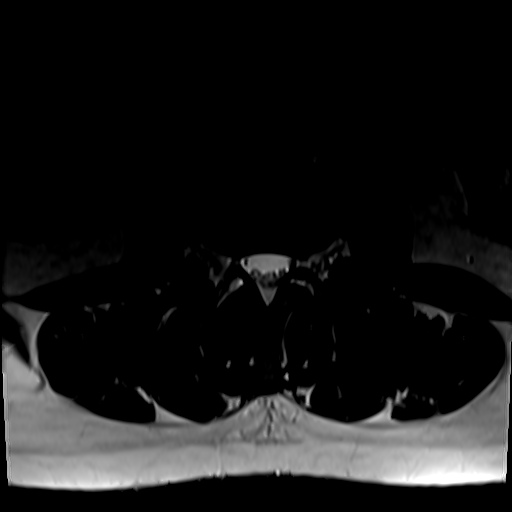
[im 40/67]
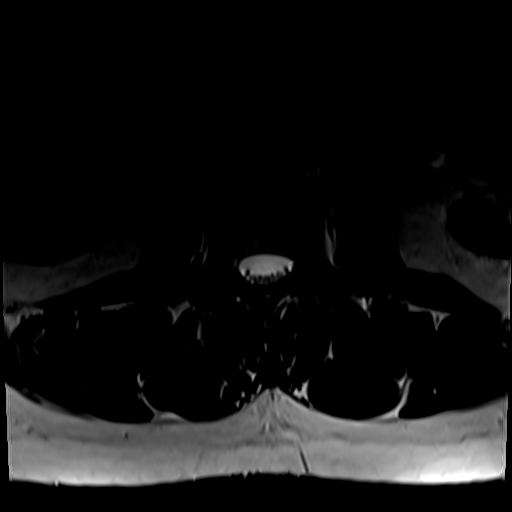
[im 49/67]
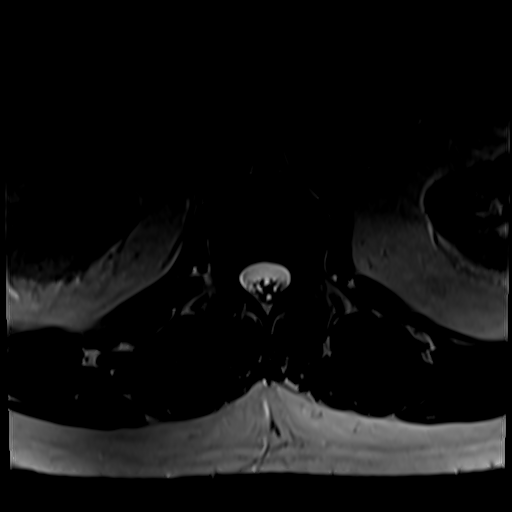
[im 58/67]
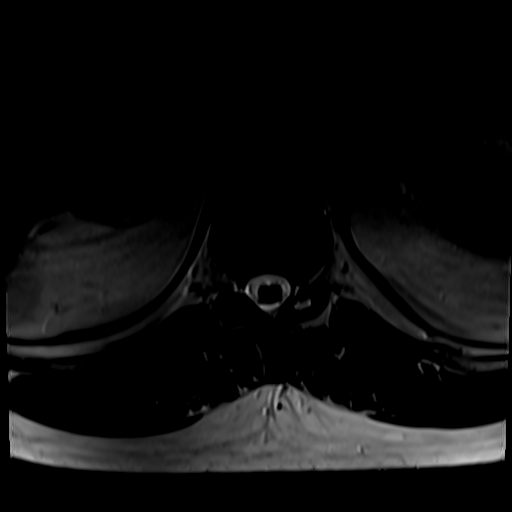
[im 67/67]
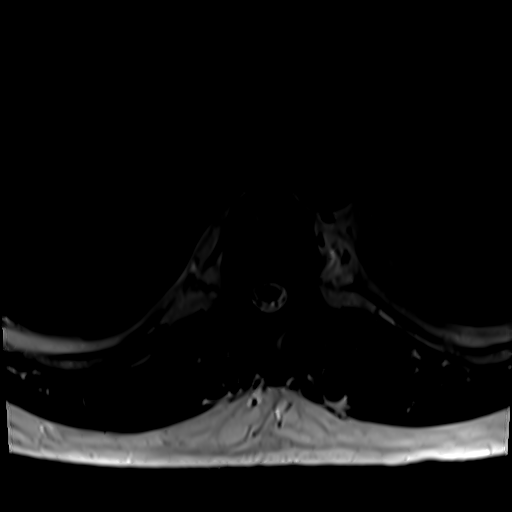

[25 of 48 positions shown; findings below may reference images not displayed]

FINDINGS: Segmentation: Standard; the lowest formed disc space is designated
L5-S1.

Alignment: There is grade 1 anterolisthesis of L4 on L5. Alignment
is otherwise normal.

Vertebrae: Vertebral body heights are preserved. Marrow signal is
normal. There is no suspicious signal abnormality or marrow edema.

Conus medullaris and cauda equina: Conus extends to the L1 level.
Conus and cauda equina appear normal.

Paraspinal and other soft tissues: There is mild perifacetal soft
tissue edema bilaterally at L4-L5. The paraspinal soft tissues are
otherwise unremarkable.

Disc levels:

The disc heights are overall preserved

T12-L1: No significant spinal canal or neural foraminal stenosis.

L1-L2: No significant spinal canal or neural foraminal stenosis

L2-L3: No significant spinal canal or neural foraminal stenosis

L3-L4: There is a mild disc bulge eccentric to the left resulting in
mild bilateral neural foraminal stenosis with contact of the exiting
left L3 nerve root (5-18), and no significant spinal canal stenosis.

L4-L5: There is grade 1 anterolisthesis with a mild disc bulge and
moderate bilateral facet arthropathy resulting in narrowing of the
subarticular zones, right more than left, with possible irritation
of the traversing right L5 nerve root, and mild bilateral neural
foraminal stenosis with possible contact of the exiting left L4
nerve root (5-18).

L5-S1: Mild bilateral facet arthropathy without significant spinal
canal or neural foraminal stenosis.
IMPRESSION: 1. Grade 1 anterolisthesis of L4 on L5 with a mild disc bulge and
moderate bilateral facet arthropathy resulting in right worse than
left subarticular zone narrowing with possible irritation of the
traversing right L5 nerve root, and mild bilateral neural foraminal
stenosis with possible contact of the exiting left L4 nerve root by
disc material. There is also mild bilateral perifacetal soft tissue
edema at this level, which could reflect a source of pain.
2. Mild disc bulge eccentric to the left at L3-L4 with possible
contact of the exiting left L3 nerve root.

## 2024-05-19 ENCOUNTER — Encounter: Payer: Self-pay | Admitting: Gastroenterology

## 2024-05-19 ENCOUNTER — Ambulatory Visit (INDEPENDENT_AMBULATORY_CARE_PROVIDER_SITE_OTHER): Admitting: Gastroenterology

## 2024-05-19 ENCOUNTER — Other Ambulatory Visit (INDEPENDENT_AMBULATORY_CARE_PROVIDER_SITE_OTHER): Payer: Self-pay

## 2024-05-19 VITALS — BP 122/72 | HR 79 | Ht >= 80 in | Wt 281.0 lb

## 2024-05-19 DIAGNOSIS — R7989 Other specified abnormal findings of blood chemistry: Secondary | ICD-10-CM

## 2024-05-19 DIAGNOSIS — R945 Abnormal results of liver function studies: Secondary | ICD-10-CM

## 2024-05-19 DIAGNOSIS — R933 Abnormal findings on diagnostic imaging of other parts of digestive tract: Secondary | ICD-10-CM | POA: Diagnosis not present

## 2024-05-19 LAB — CBC WITH DIFFERENTIAL/PLATELET
Basophils Absolute: 0.1 K/uL (ref 0.0–0.1)
Basophils Relative: 0.8 % (ref 0.0–3.0)
Eosinophils Absolute: 0.1 K/uL (ref 0.0–0.7)
Eosinophils Relative: 1.2 % (ref 0.0–5.0)
HCT: 48.8 % (ref 39.0–52.0)
Hemoglobin: 16.4 g/dL (ref 13.0–17.0)
Lymphocytes Relative: 22.5 % (ref 12.0–46.0)
Lymphs Abs: 2.4 K/uL (ref 0.7–4.0)
MCHC: 33.6 g/dL (ref 30.0–36.0)
MCV: 92.4 fl (ref 78.0–100.0)
Monocytes Absolute: 0.9 K/uL (ref 0.1–1.0)
Monocytes Relative: 8.6 % (ref 3.0–12.0)
Neutro Abs: 7.2 K/uL (ref 1.4–7.7)
Neutrophils Relative %: 66.9 % (ref 43.0–77.0)
Platelets: 133 K/uL — ABNORMAL LOW (ref 150.0–400.0)
RBC: 5.28 Mil/uL (ref 4.22–5.81)
RDW: 13.2 % (ref 11.5–15.5)
WBC: 10.7 K/uL — ABNORMAL HIGH (ref 4.0–10.5)

## 2024-05-19 LAB — COMPREHENSIVE METABOLIC PANEL WITH GFR
ALT: 61 U/L — ABNORMAL HIGH (ref 0–53)
AST: 43 U/L — ABNORMAL HIGH (ref 0–37)
Albumin: 4.6 g/dL (ref 3.5–5.2)
Alkaline Phosphatase: 90 U/L (ref 39–117)
BUN: 17 mg/dL (ref 6–23)
CO2: 31 meq/L (ref 19–32)
Calcium: 10.2 mg/dL (ref 8.4–10.5)
Chloride: 100 meq/L (ref 96–112)
Creatinine, Ser: 1.33 mg/dL (ref 0.40–1.50)
GFR: 60.16 mL/min (ref >60.00)
Glucose, Bld: 103 mg/dL — ABNORMAL HIGH (ref 70–99)
Potassium: 4.6 meq/L (ref 3.5–5.1)
Sodium: 143 meq/L (ref 135–145)
Total Bilirubin: 1.6 mg/dL — ABNORMAL HIGH (ref 0.2–1.2)
Total Protein: 8.1 g/dL (ref 6.0–8.3)

## 2024-05-19 LAB — PROTIME-INR
INR: 1.3 ratio — ABNORMAL HIGH (ref 0.8–1.0)
Prothrombin Time: 13.3 s — ABNORMAL HIGH (ref 9.6–13.1)

## 2024-05-19 LAB — IBC + FERRITIN
Ferritin: 179.8 ng/mL (ref 22.0–322.0)
Iron: 230 ug/dL — ABNORMAL HIGH (ref 42–165)
Saturation Ratios: 57.8 % — ABNORMAL HIGH (ref 20.0–50.0)
TIBC: 397.6 ug/dL (ref 250.0–450.0)
Transferrin: 284 mg/dL (ref 212.0–360.0)

## 2024-05-19 NOTE — Patient Instructions (Signed)
 You have been scheduled for a CT scan of the abdomen and pelvis at [facility], 1st floor Radiology. You are scheduled on 05/26/24 at 2 pm. You should arrive 15 minutes prior to your appointment time for registration.    Please follow the written instructions below on the day of your exam:   1) Do not eat anything after 10 am (4 hours prior to your test)   You may take any medications as prescribed with a small amount of water , if necessary. If you take any of the following medications: METFORMIN, GLUCOPHAGE, GLUCOVANCE, AVANDAMET, RIOMET, FORTAMET, ACTOPLUS MET, JANUMET, GLUMETZA or METAGLIP, you MAY be asked to HOLD this medication 48 hours AFTER the exam.   The purpose of you drinking the oral contrast is to aid in the visualization of your intestinal tract. The contrast solution may cause some diarrhea. Depending on your individual set of symptoms, you may also receive an intravenous injection of x-ray contrast/dye. Plan on being at [facility] for 45 minutes or longer, depending on the type of exam you are having performed.   If you have any questions regarding your exam or if you need to reschedule, you may call [facility] at [phone] between the hours of 8:00 am and 5:00 pm, Monday-Friday.   Your provider has requested that you go to the basement level for lab work before leaving today. Press B on the elevator. The lab is located at the first door on the left as you exit the elevator.   _______________________________________________________  If your blood pressure at your visit was 140/90 or greater, please contact your primary care physician to follow up on this.  _______________________________________________________  If you are age 50 or older, your body mass index should be between 23-30. Your Body mass index is 30.11 kg/m. If this is out of the aforementioned range listed, please consider follow up with your Primary Care Provider.  If you are age 46 or  younger, your body mass index should be between 19-25. Your Body mass index is 30.11 kg/m. If this is out of the aformentioned range listed, please consider follow up with your Primary Care Provider.   ________________________________________________________  The [facility] providers would like to encourage you to use MYCHART to communicate with providers for non-urgent requests or questions.  Due to long hold times on the telephone, sending your provider a message by [facility] may be a faster and more efficient way to get a response.  Please allow 48 business hours for a response.  Please remember that this is for non-urgent requests.  _______________________________________________________  [facility] is using a team-based approach to care.  Your team is made up of your doctor and two to three APPS. Our APPS (Nurse Practitioners and Physician Assistants) work with your physician to ensure care continuity for you. They are fully qualified to address your health concerns and develop a treatment plan. They communicate directly with your gastroenterologist to care for you. Seeing the Advanced Practice Practitioners on your physician's team can help you by facilitating care more promptly, often allowing for earlier appointments, access to diagnostic testing, procedures, and other specialty referrals.    Thank you for trusting me with your gastrointestinal care!    Dr. Victory Legrand DOUGLAS [facility]
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
## 2024-05-21 LAB — HEPATITIS B CORE ANTIBODY, IGM: Hep B C IgM: NONREACTIVE

## 2024-05-21 LAB — HEPATITIS B SURFACE ANTIGEN: Hepatitis B Surface Ag: NONREACTIVE

## 2024-05-21 LAB — HEPATITIS C ANTIBODY: Hepatitis C Ab: NONREACTIVE

## 2024-05-21 LAB — HEPATITIS B SURFACE ANTIBODY, QUANTITATIVE: Hep B S AB Quant (Post): <5 m[IU]/mL — ABNORMAL LOW (ref >=10)

## 2024-05-21 LAB — HEPATITIS A ANTIBODY, TOTAL: Hepatitis A AB,Total: REACTIVE — AB

## 2024-05-21 LAB — AFP TUMOR MARKER: AFP-Tumor Marker: 3.2 ng/mL (ref <6.1)

## 2024-05-26 ENCOUNTER — Ambulatory Visit (HOSPITAL_COMMUNITY)
Admission: RE | Admit: 2024-05-26 | Discharge: 2024-05-26 | Disposition: A | Discharge status: Home / Self Care | Source: Ambulatory Visit | Attending: Gastroenterology | Admitting: Gastroenterology

## 2024-05-26 DIAGNOSIS — R7989 Other specified abnormal findings of blood chemistry: Secondary | ICD-10-CM | POA: Diagnosis present

## 2024-05-26 DIAGNOSIS — R933 Abnormal findings on diagnostic imaging of other parts of digestive tract: Secondary | ICD-10-CM | POA: Insufficient documentation

## 2024-05-26 MED ORDER — IOHEXOL 300 MG/ML  SOLN
100.0000 mL | Freq: Once | INTRAMUSCULAR | Status: AC | PRN
Start: 2024-05-26 — End: 2024-05-26
  Administered 2024-05-26: 100 mL via INTRAVENOUS

## 2024-05-26 MED ORDER — IOHEXOL 9 MG/ML PO SOLN
500.0000 mL | ORAL | Status: AC
  Administered 2024-05-26 (×2): 500 mL via ORAL

## 2024-05-28 ENCOUNTER — Telehealth: Payer: Self-pay | Admitting: Gastroenterology

## 2024-05-28 ENCOUNTER — Other Ambulatory Visit: Payer: Self-pay

## 2024-05-28 DIAGNOSIS — R790 Abnormal level of blood mineral: Secondary | ICD-10-CM

## 2024-05-28 DIAGNOSIS — K7460 Unspecified cirrhosis of liver: Secondary | ICD-10-CM

## 2024-05-28 DIAGNOSIS — Z13810 Encounter for screening for upper gastrointestinal disorder: Secondary | ICD-10-CM

## 2024-05-28 NOTE — Telephone Encounter (Signed)
 Your rapid I spoke to this patient by phone today with results of his recent labs and CT scan.  His workup confirms that he has cirrhosis, most likely resulting from his history of fatty liver.  Iron levels also elevated, most likely related to fatty liver and cirrhosis but also could be hemochromatosis.  Reassured him that no liver mass was seen, AFP was normal, no ascites. Incidental and asymptomatic gallstone  However, he has evidence of portal hypertension which raises possibility of esophageal and/or gastric varices.  Please arrange the following:   - Upper endoscopy with me in the [facility] for cirrhosis and variceal screening   - Lab draw for HFE gene mutation for cirrhosis and elevated iron level   - 2 shot Heplisav B vaccine series for hepatitis B with our office (he is already immune to hepatitis A based on his lab work).   - Clinic follow-up with me for the next available date after the upper endoscopy.  (Can go in a 2:40 PM banding slot if needed)  - H.Legrand, MD

## 2024-05-28 NOTE — Telephone Encounter (Signed)
 Spoke with patient. Heplisav starting tomorrow-labs tomorrow-EGD on 06/29/24-office f/u 07/06/24. Confirmed with patient how to access his instructions through My Chart. Instructions will also be printed and mailed. Questions invited. No questions at this time.

## 2024-05-29 ENCOUNTER — Ambulatory Visit (INDEPENDENT_AMBULATORY_CARE_PROVIDER_SITE_OTHER)

## 2024-05-29 ENCOUNTER — Other Ambulatory Visit: Payer: Self-pay | Admitting: Gastroenterology

## 2024-05-29 ENCOUNTER — Other Ambulatory Visit

## 2024-05-29 DIAGNOSIS — K7460 Unspecified cirrhosis of liver: Secondary | ICD-10-CM

## 2024-05-29 DIAGNOSIS — Z23 Encounter for immunization: Secondary | ICD-10-CM | POA: Diagnosis not present

## 2024-05-29 DIAGNOSIS — R790 Abnormal level of blood mineral: Secondary | ICD-10-CM

## 2024-06-01 ENCOUNTER — Ambulatory Visit: Payer: Self-pay | Admitting: Gastroenterology

## 2024-06-04 ENCOUNTER — Encounter: Payer: Self-pay | Admitting: Gastroenterology

## 2024-06-05 LAB — CLIENT EDUCATION TRACKING

## 2024-06-05 LAB — HEMOCHROMATOSIS DNA-PCR(C282Y,H63D)

## 2024-06-08 ENCOUNTER — Encounter: Payer: Self-pay | Admitting: Gastroenterology

## 2024-06-08 NOTE — Telephone Encounter (Signed)
 Dr Legrand I believe the pt is speaking of Fibro scan but I want to confirm you had not mentioned any other testing with the pt.

## 2024-06-24 ENCOUNTER — Ambulatory Visit

## 2024-06-26 ENCOUNTER — Ambulatory Visit

## 2024-06-29 ENCOUNTER — Encounter: Payer: Self-pay | Admitting: Gastroenterology

## 2024-06-29 ENCOUNTER — Ambulatory Visit (AMBULATORY_SURGERY_CENTER): Admitting: Gastroenterology

## 2024-06-29 VITALS — BP 113/80 | HR 61 | Temp 98.1°F | Resp 15 | Ht >= 80 in | Wt 281.0 lb

## 2024-06-29 DIAGNOSIS — I8500 Esophageal varices without bleeding: Secondary | ICD-10-CM

## 2024-06-29 DIAGNOSIS — Z13810 Encounter for screening for upper gastrointestinal disorder: Secondary | ICD-10-CM

## 2024-06-29 DIAGNOSIS — K7581 Nonalcoholic steatohepatitis (NASH): Secondary | ICD-10-CM

## 2024-06-29 DIAGNOSIS — K766 Portal hypertension: Secondary | ICD-10-CM

## 2024-06-29 DIAGNOSIS — K7460 Unspecified cirrhosis of liver: Secondary | ICD-10-CM | POA: Diagnosis not present

## 2024-06-29 MED ORDER — SODIUM CHLORIDE 0.9 % IV SOLN: 500.0000 mL | Freq: Once | INTRAVENOUS | Status: DC

## 2024-06-29 NOTE — Patient Instructions (Signed)
 Resume previous diet.  Continue present medications.   Repeat upper endoscopy in 3 years for screening purposes.   Return to GI clinic in 4 months.   YOU HAD AN ENDOSCOPIC PROCEDURE TODAY AT THE [facility]:   Refer to the procedure report that was given to you for any specific questions about what was found during the examination.  If the procedure report does not answer your questions, please call your gastroenterologist to clarify.  If you requested that your care partner not be given the details of your procedure findings, then the procedure report has been included in a sealed envelope for you to review at your convenience later.  YOU SHOULD EXPECT: Some feelings of bloating in the abdomen. Passage of more gas than usual.  Walking can help get rid of the air that was put into your GI tract during the procedure and reduce the bloating. If you had a lower endoscopy (such as a colonoscopy or flexible sigmoidoscopy) you may notice spotting of blood in your stool or on the toilet paper. If you underwent a bowel prep for your procedure, you may not have a normal bowel movement for a few days.  Please Note:  You might notice some irritation and congestion in your nose or some drainage.  This is from the oxygen used during your procedure.  There is no need for concern and it should clear up in a day or so.  SYMPTOMS TO REPORT IMMEDIATELY:  Following upper endoscopy (EGD)  Vomiting of blood or coffee ground material  New chest pain or pain under the shoulder blades  Painful or persistently difficult swallowing  New shortness of breath  Fever of 100F or higher  Black, tarry-looking stools  For urgent or emergent issues, a gastroenterologist can be reached at any hour by calling [phone]. Do not use MyChart messaging for urgent concerns.    DIET:  We do recommend a small meal at first, but then you may proceed to your regular diet.  Drink plenty of fluids but you should avoid  alcoholic beverages for 24 hours.  ACTIVITY:  You should plan to take it easy for the rest of today and you should NOT DRIVE or use heavy machinery until tomorrow (because of the sedation medicines used during the test).    FOLLOW UP: Our staff will call the number listed on your records the next business day following your procedure.  We will call around 7:15- 8:00 am to check on you and address any questions or concerns that you may have regarding the information given to you following your procedure. If we do not reach you, we will leave a message.     If any biopsies were taken you will be contacted by phone or by letter within the next 1-3 weeks.  Please call us  at [phone] if you have not heard about the biopsies in 3 weeks.    SIGNATURES/CONFIDENTIALITY: You and/or your care partner have signed paperwork which will be entered into your electronic medical record.  These signatures attest to the fact that that the information above on your After Visit Summary has been reviewed and is understood.  Full responsibility of the confidentiality of this discharge information lies with you and/or your care-partner.

## 2024-06-29 NOTE — Op Note (Signed)
 [facility] Patient Name: Matthew Reese Procedure Date: 06/29/2024 2:01 PM MRN: [id number] Endoscopist: Victory L. Legrand , MD, [id number] Age: 53 Referring MD:  Date of Birth: [birth year] Gender: Male Account #: [id number] Procedure:                Upper GI endoscopy Indications:              Cirrhosis rule out esophageal varices                           Recent diagnosis of Matthew Reese related cirrhosis with                            thrombocytopenia.                           CTAP shows evidence of portal hypertension with                            intra-abdominal collaterals but no ascites Medicines:                Monitored Anesthesia Care Procedure:                Pre-Anesthesia Assessment:                           - Prior to the procedure, a History and Physical                            was performed, and patient medications and                            allergies were reviewed. The patient's tolerance of                            previous anesthesia was also reviewed. The risks                            and benefits of the procedure and the sedation                            options and risks were discussed with the patient.                            All questions were answered, and informed consent                            was obtained. Prior Anticoagulants: The patient has                            taken no anticoagulant or antiplatelet agents. ASA                            Grade Assessment: III - A patient with severe  systemic disease. After reviewing the risks and                            benefits, the patient was deemed in satisfactory                            condition to undergo the procedure.                           After obtaining informed consent, the endoscope was                            passed under direct vision. Throughout the                            procedure, the patient's blood pressure, pulse, and                             oxygen saturations were monitored continuously. The                            Olympus scope [id number] was introduced through the                            mouth, and advanced to the second part of duodenum.                            The upper GI endoscopy was accomplished without                            difficulty. The patient tolerated the procedure                            well. Scope In: Scope Out: Findings:                 The larynx was normal.                           Grade I, small (< 5 mm) varices were found in the                            middle third of the esophagus and in the lower                            third of the esophagus.                           The exam of the esophagus was otherwise normal.                           The stomach was normal.                           The cardia and gastric fundus were normal on  retroflexion. Specifically, no gastric varices were                            seen.                           The examined duodenum was normal. Complications:            No immediate complications. Estimated Blood Loss:     Estimated blood loss: none. Impression:               - Normal larynx.                           - Grade I and small (< 5 mm) esophageal varices.                            Nonselective beta-blocker therapy not recommended                            based on current guidelines.                           - Normal stomach.                           - Normal examined duodenum.                           - No specimens collected. Recommendation:           - Patient has a contact number available for                            emergencies. The signs and symptoms of potential                            delayed complications were discussed with the                            patient. Return to normal activities tomorrow.                            Written discharge instructions were  provided to the                            patient.                           - Resume previous diet.                           - Continue present medications.                           - Repeat upper endoscopy in 3 years for screening  purposes. (Or sooner if overall clinical picture                            suggests progression of portal hypertension)                           - Return to GI clinic in 4 months. Mikeal Winstanley L. Legrand, MD 06/29/2024 2:44:06 PM This report has been signed electronically.
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
## 2024-06-29 NOTE — Progress Notes (Signed)
 Vss nad trans to pacu

## 2024-06-30 ENCOUNTER — Telehealth: Payer: Self-pay

## 2024-06-30 NOTE — Telephone Encounter (Signed)
-----   Message from Victory LITTIE Brand III sent at 06/29/2024  3:45 PM EST ----- Regarding: Slight change in schedule This man is on my clinic schedule for 2:40 PM next Monday, November 24.  We have communicated back-and-forth with some messages, and I had a long talk with him and his wife today about his cirrhosis during his [facility] visit.  So they both feel comfortable that he does not need a clinic visit with me at this point. However, he was also supposed to get his second hepatitis B vaccination that day, which he would like to get done.  So please put him on a nursing/CMA schedule so he can come at that time to get his second hep B vaccination, but he can be taken off of my clinic schedule.  Then block that slot so I can use it to get some extra work done.  Thanks very much  HD

## 2024-06-30 NOTE — Telephone Encounter (Signed)
 Follow up call to pt, no answer.

## 2024-07-06 ENCOUNTER — Ambulatory Visit

## 2024-07-06 ENCOUNTER — Ambulatory Visit: Admitting: Gastroenterology

## 2024-07-06 DIAGNOSIS — Z23 Encounter for immunization: Secondary | ICD-10-CM

## 2024-07-23 ENCOUNTER — Encounter: Payer: Self-pay | Admitting: Gastroenterology

## 2024-11-02 ENCOUNTER — Ambulatory Visit: Admitting: Gastroenterology
